# Patient Record
Sex: Male | Born: 1975 | Race: Black or African American | Hispanic: No | Marital: Married | Smoking: Never smoker
Health system: Southern US, Community
[De-identification: ages and names within clinical notes are randomized; demographics above are authoritative.]

## PROBLEM LIST (undated history)

## (undated) DIAGNOSIS — E7800 Pure hypercholesterolemia, unspecified: Secondary | ICD-10-CM

## (undated) DIAGNOSIS — N760 Acute vaginitis: Secondary | ICD-10-CM

## (undated) DIAGNOSIS — F32A Depression, unspecified: Secondary | ICD-10-CM

## (undated) DIAGNOSIS — I10 Essential (primary) hypertension: Secondary | ICD-10-CM

## (undated) DIAGNOSIS — F329 Major depressive disorder, single episode, unspecified: Secondary | ICD-10-CM

## (undated) HISTORY — DX: Pure hypercholesterolemia, unspecified: E78.00

## (undated) HISTORY — DX: Depression, unspecified: F32.A

## (undated) HISTORY — DX: Acute vaginitis: N76.0

## (undated) HISTORY — DX: Major depressive disorder, single episode, unspecified: F32.9

## (undated) SURGERY — COLONOSCOPY WITH PROPOFOL
Anesthesia: General

---

## 2011-11-28 ENCOUNTER — Ambulatory Visit: Payer: Self-pay | Admitting: Unknown Physician Specialty

## 2011-12-24 ENCOUNTER — Ambulatory Visit: Payer: Self-pay | Admitting: Unknown Physician Specialty

## 2015-11-16 ENCOUNTER — Telehealth: Payer: Self-pay | Admitting: Family Medicine

## 2015-11-16 ENCOUNTER — Encounter: Payer: Self-pay | Admitting: Family Medicine

## 2015-11-16 ENCOUNTER — Telehealth: Payer: Self-pay | Admitting: *Deleted

## 2015-11-16 ENCOUNTER — Ambulatory Visit (INDEPENDENT_AMBULATORY_CARE_PROVIDER_SITE_OTHER): Payer: BC Managed Care – PPO | Admitting: Family Medicine

## 2015-11-16 VITALS — BP 124/76 | HR 78 | Temp 98.8°F | Ht 70.0 in | Wt 236.8 lb

## 2015-11-16 DIAGNOSIS — Z0000 Encounter for general adult medical examination without abnormal findings: Secondary | ICD-10-CM | POA: Insufficient documentation

## 2015-11-16 DIAGNOSIS — Z23 Encounter for immunization: Secondary | ICD-10-CM | POA: Diagnosis not present

## 2015-11-16 DIAGNOSIS — Z13220 Encounter for screening for lipoid disorders: Secondary | ICD-10-CM | POA: Diagnosis not present

## 2015-11-16 DIAGNOSIS — Z114 Encounter for screening for human immunodeficiency virus [HIV]: Secondary | ICD-10-CM | POA: Diagnosis not present

## 2015-11-16 DIAGNOSIS — E669 Obesity, unspecified: Secondary | ICD-10-CM

## 2015-11-16 LAB — COMPREHENSIVE METABOLIC PANEL
ALT: 11 U/L (ref 0–53)
AST: 14 U/L (ref 0–37)
Albumin: 4.4 g/dL (ref 3.5–5.2)
Alkaline Phosphatase: 43 U/L (ref 39–117)
BILIRUBIN TOTAL: 0.5 mg/dL (ref 0.2–1.2)
BUN: 12 mg/dL (ref 6–23)
CALCIUM: 9.1 mg/dL (ref 8.4–10.5)
CHLORIDE: 104 meq/L (ref 96–112)
CO2: 28 meq/L (ref 19–32)
CREATININE: 1.11 mg/dL (ref 0.40–1.50)
GFR: 95.21 mL/min (ref >60.00)
GLUCOSE: 96 mg/dL (ref 70–99)
Potassium: 4.6 mEq/L (ref 3.5–5.1)
SODIUM: 138 meq/L (ref 135–145)
Total Protein: 7.5 g/dL (ref 6.0–8.3)

## 2015-11-16 LAB — LIPID PANEL
CHOL/HDL RATIO: 5
Cholesterol: 262 mg/dL — ABNORMAL HIGH (ref 0–200)
HDL: 50 mg/dL (ref >39.00)
LDL CALC: 203 mg/dL — AB (ref 0–99)
NONHDL: 212.49
TRIGLYCERIDES: 45 mg/dL (ref 0.0–149.0)
VLDL: 9 mg/dL (ref 0.0–40.0)

## 2015-11-16 LAB — HEMOGLOBIN A1C: Hgb A1c MFr Bld: 5.8 % (ref 4.6–6.5)

## 2015-11-16 NOTE — Telephone Encounter (Signed)
Patient requesting results, they are in EPIC please advise?

## 2015-11-16 NOTE — Telephone Encounter (Signed)
Patient is wanting to try diet and exercise to get the cholesterol down first. He will call back once he looks at his schedule to make a follow up appointment.

## 2015-11-16 NOTE — Assessment & Plan Note (Signed)
Overall patient is doing well. He is obese by BMI. We discussed diet and exercise at length. He was advised on exercise. Advised to start with 2 days a week. Advised goal is 150 minutes a week. He is given a dietary handout. He declined flu shot today. He is given a Tdap today. He does not smoke. His blood pressure is in the normal range. He'll continue to follow with his physician in [place] for his ADHD. He'll continue to monitor for any recurrence of his prior fever and body ache symptoms. We will check a CMP, lipid panel, A1c, and HIV today.

## 2015-11-16 NOTE — Telephone Encounter (Signed)
I attempted to call the patient regarding his lab work. There is no answer. Left mastectomy asking him to call back to the office.  When he calls back please inform him that his cholesterol is elevated. His LDL cholesterol is 203. This is in the range where he would benefit from a medication to treat this. I would like to start him on Lipitor if he is willing. He needs to work on diet and exercise. I would like for him to follow-up in a month to see how he is doing with diet and exercise.

## 2015-11-16 NOTE — Addendum Note (Signed)
Addended by: Leone Haven on: 11/16/2015 09:45 AM   Modules accepted: Level of Service

## 2015-11-16 NOTE — Telephone Encounter (Signed)
Please see my previous phone note. I will forward it to you.

## 2015-11-16 NOTE — Progress Notes (Signed)
Patient ID: Benjamin Summers, male   DOB: Jan 18, 1976, 39 y.o.   MRN: [id number]  Benjamin Rumps, MD Phone: [phone]  Benjamin Summers is a 39 y.o. male who presents today for new patient visit.  Patient comes in to establish care today and for yearly physical. He notes overall he is doing well. He has no complaints today. He does note he had several fevers a couple months ago that were associated with body aches, though had no other symptoms. These resolved after several days. Has not had any recurrence of these. He had no upper respiratory issues with this, no shortness of breath, no chest pain, no abdominal pain, no rash, no urinary issues, and no persistent fevers. Patient does not exercise. He does note he walks a lot at work. States his diet is pretty good. Does not eat much fast food or fried foods. Sometimes he skips breakfast. He drinks mostly water. He has 3-4 sodas or sweet tea's a week. He gets vegetables most meals. Does not do much dairy. He cannot remember when he had his last tetanus shot. He thinks it was about 10 years ago. He declines flu shot. He has about 1 alcoholic drink a day. He does not smoke cigarettes. He does not use recreational drugs. He does note he is followed by a physician in [place] for his ADHD. They prescribe him Adderall. He notes a history of depression the past, though no depression at this time. No SI or HI.  Active Ambulatory Problems    Diagnosis Date Noted  . Annual physical exam 11/16/2015  . Obesity (BMI 30.0-34.9) 11/16/2015   Resolved Ambulatory Problems    Diagnosis Date Noted  . No Resolved Ambulatory Problems   Past Medical History  Diagnosis Date  . Elevated cholesterol   . Depression     Family History  Problem Relation Age of Onset  . Hypertension Mother   . Diabetes Mother     Social History   Social History  . Marital Status: Married    Spouse Name: N/A  . Number of Children: N/A  . Years of Education: N/A   Occupational  History  . Not on file.   Social History Main Topics  . Smoking status: Never Smoker   . Smokeless tobacco: Not on file  . Alcohol Use: 4.2 oz/week    7 Standard drinks or equivalent per week  . Drug Use: No  . Sexual Activity: Not on file   Other Topics Concern  . Not on file   Social History Narrative  . No narrative on file    ROS   General:  Negative for nexplained weight loss, positive for fever (see above) Skin: Negative for new or changing mole, sore that won't heal HEENT: Negative for trouble hearing, trouble seeing, ringing in ears, mouth sores, hoarseness, change in voice, dysphagia. CV:  Negative for chest pain, dyspnea, edema, palpitations Resp: Negative for cough, dyspnea, hemoptysis GI: Negative for nausea, vomiting, diarrhea, constipation, abdominal pain, melena, hematochezia. GU: Negative for dysuria, incontinence, urinary hesitance, hematuria, vaginal or penile discharge, polyuria, sexual difficulty, lumps in testicle or breasts MSK: Negative for muscle cramps or aches, joint pain or swelling Neuro: Negative for headaches, weakness, numbness, dizziness, passing out/fainting Psych: Negative for depression, anxiety, memory problems  Objective  Physical Exam Filed Vitals:   11/16/15 0806  BP: 124/76  Pulse: 78  Temp: 98.8 F (37.1 C)    Physical Exam  Constitutional: He is well-developed, well-nourished, and in no distress.  HENT:  Head: Normocephalic and atraumatic.  Right Ear: External ear normal.  Left Ear: External ear normal.  Mouth/Throat: Oropharynx is clear and moist. No oropharyngeal exudate.  Eyes: Conjunctivae are normal. Pupils are equal, round, and reactive to light.  Neck: Neck supple.  Cardiovascular: Normal rate, regular rhythm and normal heart sounds.  Exam reveals no gallop and no friction rub.   No murmur heard. Pulmonary/Chest: Effort normal and breath sounds normal. No respiratory distress. He has no wheezes. He has no rales.    Abdominal: Soft. Bowel sounds are normal. He exhibits no distension. There is no tenderness. There is no rebound and no guarding.  Musculoskeletal: He exhibits no edema.  Lymphadenopathy:    He has no cervical adenopathy.  Neurological: He is alert. Gait normal.  Skin: Skin is warm and dry. He is not diaphoretic.  Psychiatric: Mood and affect normal.     Assessment/Plan:   Annual physical exam Overall patient is doing well. He is obese by BMI. We discussed diet and exercise at length. He was advised on exercise. Advised to start with 2 days a week. Advised goal is 150 minutes a week. He is given a dietary handout. He declined flu shot today. He is given a Tdap today. He does not smoke. His blood pressure is in the normal range. He'll continue to follow with his physician in [place] for his ADHD. He'll continue to monitor for any recurrence of his prior fever and body ache symptoms. We will check a CMP, lipid panel, A1c, and HIV today.  Obesity (BMI 30.0-34.9) Advised at length on diet and exercise. See annual physical exam for discussion.    Orders Placed This Encounter  Procedures  . Tdap vaccine greater than or equal to 7yo IM  . Lipid Profile  . HIV antibody (with reflex)  . Comp Met (CMET)  . HgB A1c   Benjamin Summers

## 2015-11-16 NOTE — Progress Notes (Signed)
Pre visit review using our clinic review tool, if applicable. No additional management support is needed unless otherwise documented below in the visit note. 

## 2015-11-16 NOTE — Assessment & Plan Note (Signed)
Advised at length on diet and exercise. See annual physical exam for discussion.

## 2015-11-16 NOTE — Patient Instructions (Addendum)
Meet you. Please work on diet and exercise. The goal for exercise is 150 minutes a week. You can start with a couple days a week as you're able to. Please look at the diet listed below and incorporate some changes. We will check some lab work and call with the results.  Diet Recommendations  Starchy (carb) foods: Bread, rice, pasta, potatoes, corn, cereal, grits, crackers, bagels, muffins, all baked goods.  (Fruits, milk, and yogurt also have carbohydrate, but most of these foods will not spike your blood sugar as the starchy foods will.)  A few fruits do cause high blood sugars; use small portions of bananas (limit to 1/2 at a time), grapes, watermelon, oranges, and most tropical fruits.    Protein foods: Meat, fish, poultry, eggs, dairy foods, and beans such as pinto and kidney beans (beans also provide carbohydrate).   1. Eat at least 3 meals and 1-2 snacks per day. Never go more than 4-5 hours while awake without eating. Eat breakfast within the first hour of getting up.   2. Limit starchy foods to TWO per meal and ONE per snack. ONE portion of a starchy  food is equal to the following:   - ONE slice of bread (or its equivalent, such as half of a hamburger bun).   - 1/2 cup of a "scoopable" starchy food such as potatoes or rice.   - 15 grams of carbohydrate as shown on food label.  3. Include at every meal: a protein food, a carb food, and vegetables and/or fruit.   - Obtain twice the volume of veg's as protein or carbohydrate foods for both lunch and dinner.   - Fresh or frozen veg's are best.   - Keep frozen veg's on hand for a quick vegetable serving.

## 2015-11-16 NOTE — Telephone Encounter (Signed)
Patient requested a call back for lab results.12/08 Please Advise

## 2015-11-17 LAB — HIV ANTIBODY (ROUTINE TESTING W REFLEX): HIV: NONREACTIVE

## 2015-11-17 NOTE — Telephone Encounter (Signed)
Noted  

## 2015-11-17 NOTE — Telephone Encounter (Signed)
Benjamin Summers spoke with patient yesterday

## 2017-12-30 ENCOUNTER — Ambulatory Visit: Payer: BC Managed Care – PPO | Admitting: Family Medicine

## 2017-12-30 ENCOUNTER — Other Ambulatory Visit: Payer: Self-pay

## 2017-12-30 ENCOUNTER — Encounter: Payer: Self-pay | Admitting: Family Medicine

## 2017-12-30 VITALS — BP 138/100 | HR 83 | Temp 98.5°F | Ht 70.0 in | Wt 267.1 lb

## 2017-12-30 DIAGNOSIS — D224 Melanocytic nevi of scalp and neck: Secondary | ICD-10-CM

## 2017-12-30 DIAGNOSIS — F419 Anxiety disorder, unspecified: Secondary | ICD-10-CM

## 2017-12-30 DIAGNOSIS — R202 Paresthesia of skin: Secondary | ICD-10-CM | POA: Diagnosis not present

## 2017-12-30 DIAGNOSIS — R030 Elevated blood-pressure reading, without diagnosis of hypertension: Secondary | ICD-10-CM | POA: Diagnosis not present

## 2017-12-30 DIAGNOSIS — G44209 Tension-type headache, unspecified, not intractable: Secondary | ICD-10-CM | POA: Diagnosis not present

## 2017-12-30 DIAGNOSIS — E669 Obesity, unspecified: Secondary | ICD-10-CM | POA: Diagnosis not present

## 2017-12-30 NOTE — Progress Notes (Signed)
Benjamin Rumps, MD Phone: [phone]  Benjamin Summers is a 42 y.o. male who presents today for.  Patient notes chronic intermittent left forearm volar aspect tingling and numb sensation as well as left posterior leg numb and tingling sensation.  These been going on intermittently since he was 21.  They will last for several days and go away.  Notes intermittent low back pain.  Occasional throbbing pain down the back of his left leg.  No saddle anesthesia or bowel or bladder incontinence.  Occasional neck pain as well.  No shooting pains into his arms or legs.  No numbness or tingling elsewhere in either left upper extremity or left lower extremity.  No right sided symptoms.  Notes for several months he has had a left-sided headache that will last for few days.  He notes no new numbness or weakness.  It will go away on its own.  Will improve with over-the-counter medicine.  He did see his eye doctor to make sure his vision was okay.  He reports a hyperpigmented spot on his left scalp that has been present over the last year or so.  He wonders if he should have this looked at.  Blood pressure has been high in the past.  He notes no chest pain or shortness of breath.  No medication currently.  Social History   Tobacco Use  Smoking Status Never Smoker  Smokeless Tobacco Never Used     ROS see history of present illness  Objective  Physical Exam Vitals:   12/30/17 1406  BP: (!) 138/100  Pulse: 83  Temp: 98.5 F (36.9 C)  SpO2: 96%    BP Readings from Last 3 Encounters:  12/30/17 (!) 138/100  11/16/15 124/76   Wt Readings from Last 3 Encounters:  12/30/17 267 lb 1.9 oz (121.2 kg)  11/16/15 236 lb 12.8 oz (107.4 kg)    Physical Exam  Constitutional: No distress.  HENT:  Head:    Cardiovascular: Normal rate, regular rhythm and normal heart sounds.  Pulmonary/Chest: Effort normal and breath sounds normal.  Musculoskeletal: He exhibits no edema.  No midline spine  tenderness, no midline spine step-off, no muscular back or neck tenderness  Neurological: He is alert. Gait normal.  CN 2-12 intact, 5/5 strength in bilateral biceps, triceps, grip, quads, hamstrings, plantar and dorsiflexion, sensation to light touch intact in bilateral UE and LE, sensation to temperature and pain intact bilateral upper extremities, normal gait  Skin: Skin is warm and dry. He is not diaphoretic.     Assessment/Plan: Please see individual problem list.  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  Elevated blood pressure reading Elevated today.  We will have him return for blood pressure check and if still elevated start on medication.  Obesity (BMI 35.0-39.9 without comorbidity) Patient is put on about 30 pounds.  Discussed working on diet and exercise.  Paresthesia of left arm and leg Patient with tingling sensation in the volar aspect of his left forearm as well as his posterior left leg with intermittent associated neck and back discomfort.  Suspect nerve impingement.  He is neurologically intact.  I do not think this is a central lesion based on his chronicity and presenting symptoms.  We will refer to neurology for further evaluation.  Anxiety At the end of the visit the patient brought up that he has been anxious.  He requests Xanax.  He notes no depression.  I advised that  I typically do not prescribe Xanax without trying an additional medication such as an SSRI.  He voiced understanding and continued to request the Xanax as this has worked for him in the past.  I advised that we could discuss this further at a follow-up visit.  Headache Suspect tension headache versus related to blood pressure.  Will refer to neurology and recheck blood pressure in 1 week.   Orders Placed This Encounter  Procedures  . Lipid panel    Standing Status:   Future    Number of Occurrences:   1    Standing Expiration Date:    12/30/2018  . Comp Met (CMET)    Standing Status:   Future    Number of Occurrences:   1    Standing Expiration Date:   12/30/2018  . Hemoglobin A1c    Standing Status:   Future    Number of Occurrences:   1    Standing Expiration Date:   12/30/2018  . CBC    Standing Status:   Future    Number of Occurrences:   1    Standing Expiration Date:   12/30/2018  . TSH    Standing Status:   Future    Number of Occurrences:   1    Standing Expiration Date:   12/30/2018  . Ambulatory referral to Neurology    Referral Priority:   Routine    Referral Type:   Consultation    Referral Reason:   Specialty Services Required    Requested Specialty:   Neurology    Number of Visits Requested:   1  . Ambulatory referral to Dermatology    Referral Priority:   Routine    Referral Type:   Consultation    Referral Reason:   Specialty Services Required    Requested Specialty:   Dermatology    Number of Visits Requested:   1    No orders of the defined types were placed in this encounter.  The 10-year ASCVD risk score Mikey Bussing DC Brooke Bonito., et al., 2013) is: 3.7%   Values used to calculate the score:     Age: 89 years     Sex: Male     Is Non-Hispanic African American: Yes     Diabetic: No     Tobacco smoker: No     Systolic Blood Pressure: 594 mmHg     Is BP treated: No     HDL Cholesterol: 51.5 mg/dL     Total Cholesterol: 247 mg/dL   Benjamin Rumps, MD [place]

## 2017-12-30 NOTE — Patient Instructions (Signed)
Nice to see you. We will refer you to neurology for the tingling sensation that she has had chronically. We will get you to see dermatology. We will have you return for fasting lab work. If you develop numbness, weakness, worsening headache, vision changes, or any new or changing symptoms please seek medical attention.

## 2017-12-31 ENCOUNTER — Other Ambulatory Visit (INDEPENDENT_AMBULATORY_CARE_PROVIDER_SITE_OTHER): Payer: BC Managed Care – PPO

## 2017-12-31 DIAGNOSIS — R030 Elevated blood-pressure reading, without diagnosis of hypertension: Secondary | ICD-10-CM

## 2017-12-31 LAB — LIPID PANEL
Cholesterol: 247 mg/dL — ABNORMAL HIGH (ref 0–200)
HDL: 51.5 mg/dL (ref >39.00)
LDL CALC: 179 mg/dL — AB (ref 0–99)
NONHDL: 195.54
Total CHOL/HDL Ratio: 5
Triglycerides: 83 mg/dL (ref 0.0–149.0)
VLDL: 16.6 mg/dL (ref 0.0–40.0)

## 2017-12-31 LAB — CBC
HCT: 45.8 % (ref 39.0–52.0)
Hemoglobin: 15.2 g/dL (ref 13.0–17.0)
MCHC: 33.2 g/dL (ref 30.0–36.0)
MCV: 83.9 fl (ref 78.0–100.0)
PLATELETS: 198 10*3/uL (ref 150.0–400.0)
RBC: 5.46 Mil/uL (ref 4.22–5.81)
RDW: 14.4 % (ref 11.5–15.5)
WBC: 5.9 10*3/uL (ref 4.0–10.5)

## 2017-12-31 LAB — COMPREHENSIVE METABOLIC PANEL
ALT: 13 U/L (ref 0–53)
AST: 12 U/L (ref 0–37)
Albumin: 4.5 g/dL (ref 3.5–5.2)
Alkaline Phosphatase: 40 U/L (ref 39–117)
BUN: 11 mg/dL (ref 6–23)
CO2: 23 mEq/L (ref 19–32)
CREATININE: 1.25 mg/dL (ref 0.40–1.50)
Calcium: 9.2 mg/dL (ref 8.4–10.5)
Chloride: 105 mEq/L (ref 96–112)
GFR: 82.11 mL/min (ref >60.00)
GLUCOSE: 100 mg/dL — AB (ref 70–99)
POTASSIUM: 4.2 meq/L (ref 3.5–5.1)
SODIUM: 141 meq/L (ref 135–145)
TOTAL PROTEIN: 7.7 g/dL (ref 6.0–8.3)
Total Bilirubin: 0.4 mg/dL (ref 0.2–1.2)

## 2017-12-31 LAB — HEMOGLOBIN A1C: HEMOGLOBIN A1C: 6 % (ref 4.6–6.5)

## 2017-12-31 LAB — TSH: TSH: 1.64 u[IU]/mL (ref 0.35–4.50)

## 2018-01-01 DIAGNOSIS — I10 Essential (primary) hypertension: Secondary | ICD-10-CM | POA: Insufficient documentation

## 2018-01-01 DIAGNOSIS — F419 Anxiety disorder, unspecified: Secondary | ICD-10-CM | POA: Insufficient documentation

## 2018-01-01 DIAGNOSIS — R202 Paresthesia of skin: Secondary | ICD-10-CM | POA: Insufficient documentation

## 2018-01-01 DIAGNOSIS — R51 Headache: Secondary | ICD-10-CM

## 2018-01-01 DIAGNOSIS — D224 Melanocytic nevi of scalp and neck: Secondary | ICD-10-CM | POA: Insufficient documentation

## 2018-01-01 DIAGNOSIS — R519 Headache, unspecified: Secondary | ICD-10-CM | POA: Insufficient documentation

## 2018-01-01 NOTE — Assessment & Plan Note (Signed)
Elevated today.  We will have him return for blood pressure check and if still elevated start on medication.

## 2018-01-01 NOTE — Assessment & Plan Note (Signed)
Suspect tension headache versus related to blood pressure.  Will refer to neurology and recheck blood pressure in 1 week.

## 2018-01-01 NOTE — Assessment & Plan Note (Signed)
At the end of the visit the patient brought up that he has been anxious.  He requests Xanax.  He notes no depression.  I advised that I typically do not prescribe Xanax without trying an additional medication such as an SSRI.  He voiced understanding and continued to request the Xanax as this has worked for him in the past.  I advised that we could discuss this further at a follow-up visit.

## 2018-01-01 NOTE — Assessment & Plan Note (Signed)
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

## 2018-01-01 NOTE — Assessment & Plan Note (Signed)
Patient with tingling sensation in the volar aspect of his left forearm as well as his posterior left leg with intermittent associated neck and back discomfort.  Suspect nerve impingement.  He is neurologically intact.  I do not think this is a central lesion based on his chronicity and presenting symptoms.  We will refer to neurology for further evaluation.

## 2018-01-01 NOTE — Assessment & Plan Note (Signed)
Patient is put on about 30 pounds.  Discussed working on diet and exercise.

## 2018-01-06 ENCOUNTER — Telehealth: Payer: Self-pay

## 2018-01-06 NOTE — Telephone Encounter (Signed)
Copied from [place]. Topic: Quick Communication - Lab Results >> Jan 06, 2018  3:10 PM Juanda Chance, [place] wrote: Left message to schedule patient for blood pressure check ok for pec to schedule a blood pressure check with the nurse. >> Jan 06, 2018  4:01 PM Yvette Rack wrote: Pt would hate to come into office and pay $40 co pay just to get BP checked please call pt to let him know if he need to pay or not

## 2018-01-07 NOTE — Telephone Encounter (Signed)
Left voice mail to call back ok for PEC to speak to patient 

## 2018-01-07 NOTE — Telephone Encounter (Signed)
Please contact the patient and see if he has been checking his blood pressure at home.  If he has please find out what his numbers have been.  If he has not I would suggest he come to the office to have it checked.

## 2018-01-09 NOTE — Telephone Encounter (Signed)
Left voice mail to call back ok for PEC to speak to patient 

## 2018-01-13 ENCOUNTER — Ambulatory Visit: Payer: Self-pay | Admitting: *Deleted

## 2018-01-13 NOTE — Telephone Encounter (Signed)
Pt  States  He  Was  Told   By  The office  To  Have  His  bp   Checked   Triage  assesment   Done    Pt  Reports  No   New  Symptoms    Has  appt  With  Neurologist  Feb  25    Dr Tharon Aquas  Office  Called  And  Spoke  With  [place]  Who stated  He needed  A  Nurse visit  For  bp  Check   She  Request pt transferred  To  Her  So  She  Can make  Him an  Appointment    Answer Assessment - Initial Assessment Questions 1. SYMPTOM: "What is the main symptom you are concerned about?" (e.g., weakness, numbness)     Numbness   And  Tingling    l  Arm  And l  Side  No  Worse   Than  When  He  Saw  Dr  Caryl Bis   bp  Elevated   144/100  5  mins  Ago   2. ONSET: "When did this start?" (minutes, hours, days; while sleeping)        20 years  Chronic  3. LAST NORMAL: "When was the last time you were normal (no symptoms)?"         chronic 4. PATTERN "Does this come and go, or has it been constant since it started?"  "Is it present now?"       Constant   5. CARDIAC SYMPTOMS: "Have you had any of the following symptoms: chest pain, difficulty breathing, palpitations?"         No   6. NEUROLOGIC SYMPTOMS: "Have you had any of the following symptoms: headache, dizziness, vision loss, double vision, changes in speech, unsteady on your feet?"           No    7. OTHER SYMPTOMS: "Do you have any other symptoms?"     hypertension 8. PREGNANCY: "Is there any chance you are pregnant?" "When was your last menstrual period?"     n/a  Protocols used: NEUROLOGIC DEFICIT-A-AH

## 2018-01-14 ENCOUNTER — Ambulatory Visit (INDEPENDENT_AMBULATORY_CARE_PROVIDER_SITE_OTHER): Payer: BC Managed Care – PPO | Admitting: *Deleted

## 2018-01-14 VITALS — BP 138/98 | HR 78 | Resp 20

## 2018-01-14 DIAGNOSIS — R030 Elevated blood-pressure reading, without diagnosis of hypertension: Secondary | ICD-10-CM

## 2018-01-14 NOTE — Progress Notes (Signed)
Patient presented for follow up on BP with nursing from visit dated 12/30/17. Patient allowed to rest 15 to 20 min BP attained in left arm 138/98 pulse 78.

## 2018-01-14 NOTE — Telephone Encounter (Signed)
Patient has appointment for nurse visit today 01/14/18 ,he never returned my call

## 2018-01-14 NOTE — Progress Notes (Addendum)
Blood pressure remains uncontrolled.  I would suggest placing him on medication for this.  If he is willing I can send amlodipine to his pharmacy.  Thanks.

## 2018-01-16 NOTE — Progress Notes (Signed)
Patient staed he wants time to think about going on medication he will call back when he decides.

## 2018-01-30 ENCOUNTER — Telehealth: Payer: Self-pay | Admitting: Family Medicine

## 2018-01-30 NOTE — Telephone Encounter (Unsigned)
Copied from [place] [id number]. Topic: Quick Communication - See Telephone Encounter >> Jan 30, 2018  2:03 PM Hewitt Shorts wrote: CRM for notification. See Telephone encounter for: pt called to say that dr Caryl Bis wanted him to start on a blood pressure medication but pt doesn't know what the name of the medication is pt does want to move forward but make sure that is generic brand  [facility] [address]     01/30/18.

## 2018-01-30 NOTE — Telephone Encounter (Signed)
Please advise 

## 2018-02-01 MED ORDER — AMLODIPINE BESYLATE 5 MG PO TABS: 5.0000 mg | ORAL_TABLET | Freq: Every day | ORAL | 3 refills | Status: DC

## 2018-02-01 NOTE — Telephone Encounter (Signed)
I have sent in amlodipine to his pharmacy.  Please let him know this.  He needs a recheck in 1 month after starting this.  Thanks.

## 2018-02-02 NOTE — Telephone Encounter (Signed)
Left message to notify

## 2018-02-19 ENCOUNTER — Encounter: Payer: Self-pay | Admitting: Family Medicine

## 2018-02-19 ENCOUNTER — Ambulatory Visit: Payer: BC Managed Care – PPO | Admitting: Family Medicine

## 2018-02-19 DIAGNOSIS — R202 Paresthesia of skin: Secondary | ICD-10-CM | POA: Diagnosis not present

## 2018-02-19 DIAGNOSIS — G44209 Tension-type headache, unspecified, not intractable: Secondary | ICD-10-CM | POA: Diagnosis not present

## 2018-02-19 DIAGNOSIS — D224 Melanocytic nevi of scalp and neck: Secondary | ICD-10-CM | POA: Diagnosis not present

## 2018-02-19 DIAGNOSIS — I10 Essential (primary) hypertension: Secondary | ICD-10-CM

## 2018-02-19 DIAGNOSIS — F419 Anxiety disorder, unspecified: Secondary | ICD-10-CM

## 2018-02-19 MED ORDER — AMLODIPINE BESYLATE 10 MG PO TABS: 10.0000 mg | ORAL_TABLET | Freq: Every day | ORAL | 3 refills | Status: DC

## 2018-02-19 NOTE — Patient Instructions (Signed)
Nice to see you. Your blood pressure is better though still not quite at goal.  We will increase your amlodipine to 10 mg.  You may take two 5 mg tablets daily until you run out and then get the new prescription. If you decide you want to proceed with neurological evaluation please let us know and we can get an MRI or refer you back to neurology. If you would like to try something for anxiety on a daily basis please let us know.

## 2018-02-19 NOTE — Assessment & Plan Note (Signed)
No recurrence.  Monitor for recurrence.

## 2018-02-19 NOTE — Assessment & Plan Note (Signed)
Suspect benign lesion based on appearance.  He defers dermatology evaluation at this time.

## 2018-02-19 NOTE — Assessment & Plan Note (Signed)
Continues to have intermittent tingling though this does appear somewhat improved.  I am not sure if it is just coincidence with starting the amlodipine or if it is a response to the medicine versus response to his decreased blood pressure.  I did advise neurology evaluation.  Discussed we could also perform MRI brain to evaluate further though he defers neurological evaluation at this time and wants to monitor for further improvement.  If not improving he will contact us for referral or MRI.  Given return precautions.

## 2018-02-19 NOTE — Assessment & Plan Note (Signed)
Discussed treatment with SSRI or therapy.  He deferred at this time.  He will monitor.

## 2018-02-19 NOTE — Progress Notes (Signed)
[name], MD Phone: [phone]  Benjamin Summers is a 42 y.o. male who presents today for follow-up.  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  Edema- no  Patient reports the tingling that he gets in the left side of his body has been improved from prior.  Typically occurs in the volar aspect of his left arm in the posterior aspect of his left leg.  Never occurs on the right side of his body.  He noticed the improvement after starting on the blood pressure medication.  He did not end up seeing neurology mostly due to financial issues.  He notes no headaches.  No new tingling.  No numbness, weakness, or other neurological issues.  Anxiety: In the past he has been on benzos as well as Lexapro.  He thinks he had some sexual dysfunction with the Lexapro.  Many of the benzos did not work quickly enough for him.  Notes slight depression.  No SI.  He did not see dermatology for the nevus on his scalp.  It has not changed since our last visit.  He reports financially it is not feasible for him to see dermatology at this time.  Social History   Tobacco Use  Smoking Status Never Smoker  Smokeless Tobacco Never Used     ROS see history of present illness  Objective  Physical Exam Vitals:   02/19/18 0955  BP: 140/90  Pulse: 77  Temp: 98.4 F (36.9 C)  SpO2: 97%    BP Readings from Last 3 Encounters:  02/19/18 140/90  01/14/18 (!) 138/98  12/30/17 (!) 138/100   Wt Readings from Last 3 Encounters:  02/19/18 265 lb 6.4 oz (120.4 kg)  12/30/17 267 lb 1.9 oz (121.2 kg)  11/16/15 236 lb 12.8 oz (107.4 kg)    Physical Exam  Constitutional: No distress.  Cardiovascular: Normal rate, regular rhythm and normal heart sounds.  Pulmonary/Chest: Effort normal and breath sounds normal.  Musculoskeletal: He exhibits no edema.  Neurological: He is alert. Gait normal.  CN  2-12 intact, 5/5 strength in bilateral biceps, triceps, grip, quads, hamstrings, plantar and dorsiflexion, sensation to light touch intact in bilateral UE and LE  Skin: Skin is warm and dry. He is not diaphoretic.  Nevus on left scalp appears similar to prior is a 4 mm circular hyperpigmented lesion with regular borders and uniform color   Assessment/Plan: Please see individual problem list.  Nevus of scalp Suspect benign lesion based on appearance.  He defers dermatology evaluation at this time.  Paresthesia of left arm and leg Continues to have intermittent tingling though this does appear somewhat improved.  I am not sure if it is just coincidence with starting the amlodipine or if it is a response to the medicine versus response to his decreased blood pressure.  I did advise neurology evaluation.  Discussed we could also perform MRI brain to evaluate further though he defers neurological evaluation at this time and wants to monitor for further improvement.  If not improving he will contact us for referral or MRI.  Given return precautions.  Headache No recurrence.  Monitor for recurrence.  Hypertension Blood pressure improved.  Not quite at goal though.  We will increase his amlodipine to 10 mg.  Follow-up in 1 month for BP check with nursing.  Anxiety Discussed treatment with SSRI or therapy.  He deferred at this time.  He  will monitor.  No orders of the defined types were placed in this encounter.   Meds ordered this encounter  Medications  . amLODipine (NORVASC) 10 MG tablet    Sig: Take 1 tablet (10 mg total) by mouth daily.    Dispense:  90 tablet    Refill:  [place], MD [place]

## 2018-02-19 NOTE — Assessment & Plan Note (Signed)
Blood pressure improved.  Not quite at goal though.  We will increase his amlodipine to 10 mg.  Follow-up in 1 month for BP check with nursing.

## 2018-03-25 ENCOUNTER — Telehealth: Payer: Self-pay | Admitting: *Deleted

## 2018-03-25 ENCOUNTER — Ambulatory Visit (INDEPENDENT_AMBULATORY_CARE_PROVIDER_SITE_OTHER): Payer: BC Managed Care – PPO | Admitting: *Deleted

## 2018-03-25 VITALS — BP 140/88 | HR 78 | Resp 18

## 2018-03-25 DIAGNOSIS — I10 Essential (primary) hypertension: Secondary | ICD-10-CM

## 2018-03-25 NOTE — Telephone Encounter (Signed)
Left message to call office [facility] nurse may advise patient of below statement from PCP.   Attestation signed by Leone Haven, MD at 03/25/2018 12:29 PM  Blood pressure is pretty much unchanged from prior.  Still not at goal.  I would suggest adding an additional blood pressure medication such as hydrochlorothiazide.  This would require lab work in several weeks.  If he would like to do this I can send it to his pharmacy.

## 2018-03-25 NOTE — Progress Notes (Signed)
Patient presented for one month follow up on BP after increasing amlodipine to 10 mg daily . BP taken in left arm 138/82 pulse 78 patient allowed to rest additional 10 -12 minutes BP in right arm 140/88 pulse 78.

## 2018-03-25 NOTE — Progress Notes (Signed)
Left message for patient  To call office see telephone encounter.

## 2018-03-26 ENCOUNTER — Telehealth: Payer: Self-pay | Admitting: Family Medicine

## 2018-03-26 DIAGNOSIS — I10 Essential (primary) hypertension: Secondary | ICD-10-CM

## 2018-03-26 NOTE — Telephone Encounter (Signed)
Patient states that she would like to try the hydrochlorothiazide.

## 2018-03-26 NOTE — Telephone Encounter (Signed)
Message from Dr. Caryl Bis read to patient; verbalizes understanding. Agrees to hydrochlorothiazide. [facility] [address]   [place].  Please advise re: time frame for lab draw. [phone]

## 2018-03-26 NOTE — Telephone Encounter (Signed)
Patient would like to try hydrocholorthiazide

## 2018-03-28 MED ORDER — HYDROCHLOROTHIAZIDE 12.5 MG PO TABS: 12.5000 mg | ORAL_TABLET | Freq: Every day | ORAL | 1 refills | Status: DC

## 2018-03-28 NOTE — Addendum Note (Signed)
Addended by: Caryl Bis, Ormand Senn G on: 03/28/2018 10:09 AM   Modules accepted: Orders

## 2018-03-28 NOTE — Telephone Encounter (Signed)
HCTZ sent to pharmacy.  He will need to be set up for lab work in 3-4 weeks.  An order has been placed.  Please get this scheduled.  Thanks.

## 2018-03-30 NOTE — Telephone Encounter (Signed)
Called patient to get him scheduled for lab visit unable to leave voicemail due to patient not having voicemail being set up.

## 2018-04-01 NOTE — Telephone Encounter (Signed)
Called patient on several occasions and was not able to leave a message due to voicemail not being set up.

## 2018-04-01 NOTE — Telephone Encounter (Signed)
Sent patient a letter due to not being able to reach patient by phone to contact office to schedule lab appointment per Dr. Caryl Bis.

## 2018-04-09 NOTE — Progress Notes (Signed)
See telephone encounter.

## 2018-04-09 NOTE — Telephone Encounter (Signed)
Letter mailed

## 2018-04-23 ENCOUNTER — Other Ambulatory Visit (INDEPENDENT_AMBULATORY_CARE_PROVIDER_SITE_OTHER): Payer: BC Managed Care – PPO

## 2018-04-23 DIAGNOSIS — I10 Essential (primary) hypertension: Secondary | ICD-10-CM | POA: Diagnosis not present

## 2018-04-23 LAB — BASIC METABOLIC PANEL
BUN: 19 mg/dL (ref 6–23)
CALCIUM: 8.8 mg/dL (ref 8.4–10.5)
CHLORIDE: 101 meq/L (ref 96–112)
CO2: 28 meq/L (ref 19–32)
CREATININE: 1.18 mg/dL (ref 0.40–1.50)
GFR: 87.62 mL/min (ref >60.00)
Glucose, Bld: 84 mg/dL (ref 70–99)
Potassium: 3.9 mEq/L (ref 3.5–5.1)
Sodium: 137 mEq/L (ref 135–145)

## 2018-05-26 ENCOUNTER — Ambulatory Visit: Payer: BC Managed Care – PPO | Admitting: Family Medicine

## 2018-06-19 ENCOUNTER — Ambulatory Visit: Payer: BC Managed Care – PPO | Admitting: Psychology

## 2018-09-20 ENCOUNTER — Other Ambulatory Visit: Payer: Self-pay | Admitting: Family Medicine

## 2018-10-19 ENCOUNTER — Emergency Department
Admission: EM | Admit: 2018-10-19 | Discharge: 2018-10-19 | Disposition: A | Discharge status: Home / Self Care | Payer: BC Managed Care – PPO | Attending: Emergency Medicine | Admitting: Emergency Medicine

## 2018-10-19 ENCOUNTER — Emergency Department: Payer: BC Managed Care – PPO

## 2018-10-19 ENCOUNTER — Other Ambulatory Visit: Payer: Self-pay

## 2018-10-19 ENCOUNTER — Encounter: Payer: Self-pay | Admitting: Emergency Medicine

## 2018-10-19 DIAGNOSIS — R109 Unspecified abdominal pain: Secondary | ICD-10-CM | POA: Diagnosis present

## 2018-10-19 DIAGNOSIS — Z79899 Other long term (current) drug therapy: Secondary | ICD-10-CM | POA: Diagnosis not present

## 2018-10-19 DIAGNOSIS — N200 Calculus of kidney: Secondary | ICD-10-CM | POA: Diagnosis not present

## 2018-10-19 DIAGNOSIS — I10 Essential (primary) hypertension: Secondary | ICD-10-CM | POA: Insufficient documentation

## 2018-10-19 HISTORY — DX: Essential (primary) hypertension: I10

## 2018-10-19 LAB — COMPREHENSIVE METABOLIC PANEL
ALT: 21 U/L (ref 0–44)
AST: 19 U/L (ref 15–41)
Albumin: 4.7 g/dL (ref 3.5–5.0)
Alkaline Phosphatase: 49 U/L (ref 38–126)
Anion gap: 7 (ref 5–15)
BUN: 21 mg/dL — ABNORMAL HIGH (ref 6–20)
CHLORIDE: 102 mmol/L (ref 98–111)
CO2: 27 mmol/L (ref 22–32)
Calcium: 9 mg/dL (ref 8.9–10.3)
Creatinine, Ser: 1.68 mg/dL — ABNORMAL HIGH (ref 0.61–1.24)
GFR, EST AFRICAN AMERICAN: 57 mL/min — AB (ref >60)
GFR, EST NON AFRICAN AMERICAN: 49 mL/min — AB (ref >60)
Glucose, Bld: 109 mg/dL — ABNORMAL HIGH (ref 70–99)
POTASSIUM: 3.5 mmol/L (ref 3.5–5.1)
SODIUM: 136 mmol/L (ref 135–145)
Total Bilirubin: 0.8 mg/dL (ref 0.3–1.2)
Total Protein: 8 g/dL (ref 6.5–8.1)

## 2018-10-19 LAB — URINALYSIS, COMPLETE (UACMP) WITH MICROSCOPIC
BILIRUBIN URINE: NEGATIVE
Bacteria, UA: NONE SEEN
GLUCOSE, UA: NEGATIVE mg/dL
HGB URINE DIPSTICK: NEGATIVE
KETONES UR: NEGATIVE mg/dL
LEUKOCYTES UA: NEGATIVE
NITRITE: NEGATIVE
PROTEIN: NEGATIVE mg/dL
Specific Gravity, Urine: 1.025 (ref 1.005–1.030)
Squamous Epithelial / LPF: NONE SEEN (ref 0–5)
pH: 5 (ref 5.0–8.0)

## 2018-10-19 LAB — CBC
HCT: 50.8 % (ref 39.0–52.0)
Hemoglobin: 16.6 g/dL (ref 13.0–17.0)
MCH: 27.7 pg (ref 26.0–34.0)
MCHC: 32.7 g/dL (ref 30.0–36.0)
MCV: 84.8 fL (ref 80.0–100.0)
PLATELETS: 218 10*3/uL (ref 150–400)
RBC: 5.99 MIL/uL — ABNORMAL HIGH (ref 4.22–5.81)
RDW: 14.5 % (ref 11.5–15.5)
WBC: 8 10*3/uL (ref 4.0–10.5)
nRBC: 0 % (ref 0.0–0.2)

## 2018-10-19 MED ORDER — TAMSULOSIN HCL 0.4 MG PO CAPS: 0.4000 mg | ORAL_CAPSULE | Freq: Every day | ORAL | 0 refills | Status: DC

## 2018-10-19 MED ORDER — OXYCODONE-ACETAMINOPHEN 5-325 MG PO TABS: 1.0000 | ORAL_TABLET | ORAL | 0 refills | Status: AC | PRN

## 2018-10-19 NOTE — ED Triage Notes (Signed)
Pt in with co right flank pain radiates to testicles since this am. Denies any dysuria or hematuria.

## 2018-10-19 NOTE — ED Provider Notes (Signed)
[facility] Emergency Department Provider Note  ___________________________________________   First MD Initiated Contact with Patient 10/19/18 2044     (approximate)  I have reviewed the triage vital signs and the nursing notes.   HISTORY  Chief Complaint Flank Pain   HPI Benjamin Summers is a 42 y.o. male with a history of hypertension and hypercholesterolemia was presented with right lower quadrant abdominal pain.  The patient says that he has been having intermittent right lower quadrant abdominal pain off and on for several months.  He says the pain is sharp and severe and radiates to his right flank as well as the right testicle.  It is not exacerbated with movement.  Not reporting any nausea or vomiting or fever at this time.  Says that the pain was severe this morning and last for several hours.  No urinary complaints at this time.  Says that he also has been having moderate constipation and says that he sometimes has partial relief of the pain if he moves his bowels.   Past Medical History:  Diagnosis Date  . Depression   . Elevated cholesterol   . Hypertension     Patient Active Problem List   Diagnosis Date Noted  . Paresthesia of left arm and leg 01/01/2018  . Hypertension 01/01/2018  . Nevus of scalp 01/01/2018  . Anxiety 01/01/2018  . Headache 01/01/2018  . Annual physical exam 11/16/2015  . Obesity (BMI 35.0-39.9 without comorbidity) 11/16/2015      Prior to Admission medications   Medication Sig Start Date End Date Taking? Authorizing Provider  amLODipine (NORVASC) 10 MG tablet Take 1 tablet (10 mg total) by mouth daily. 02/19/18   Leone Haven, MD  hydrochlorothiazide (HYDRODIURIL) 12.5 MG tablet TAKE 1 TABLET BY MOUTH EVERY DAY 09/21/18   Leone Haven, MD    Allergies Patient has no known allergies.  Family History  Problem Relation Age of Onset  . Hypertension Mother   . Diabetes Mother     Social History Social  History   Tobacco Use  . Smoking status: Never Smoker  . Smokeless tobacco: Never Used  Substance Use Topics  . Alcohol use: Yes    Alcohol/week: 7.0 standard drinks    Types: 7 Standard drinks or equivalent per week  . Drug use: No    Review of Systems  Constitutional: No fever/chills Eyes: No visual changes. ENT: No sore throat. Cardiovascular: Denies chest pain. Respiratory: Denies shortness of breath. Gastrointestinal:  No nausea, no vomiting.  No diarrhea.  No constipation. Genitourinary: Negative for dysuria. Musculoskeletal: As above Skin: Negative for rash. Neurological: Negative for headaches, focal weakness or numbness.   ____________________________________________   PHYSICAL EXAM:  VITAL SIGNS: ED Triage Vitals [10/19/18 1903]  Enc Vitals Group     BP (!) 141/91     Pulse Rate 84     Resp 20     Temp 98.6 F (37 C)     Temp Source Oral     SpO2 98 %     Weight 240 lb (108.9 kg)     Height      Head Circumference      Peak Flow      Pain Score 1     Pain Loc      Pain Edu?      Excl. in [place]?     Constitutional: Alert and oriented. Well appearing and in no acute distress. Eyes: Conjunctivae are normal.  Head: Atraumatic. Nose: No  congestion/rhinnorhea. Mouth/Throat: Mucous membranes are moist.  Neck: No stridor.   Cardiovascular: Normal rate, regular rhythm. Grossly normal heart sounds Respiratory: Normal respiratory effort.  No retractions. Lungs CTAB. Gastrointestinal: Soft and nontender. No distention. No CVA tenderness. Musculoskeletal: No lower extremity tenderness nor edema.  No joint effusions. Neurologic:  Normal speech and language. No gross focal neurologic deficits are appreciated. Skin:  Skin is warm, dry and intact. No rash noted. Psychiatric: Mood and affect are normal. Speech and behavior are normal.  ____________________________________________   LABS (all labs ordered are listed, but only abnormal results are  displayed)  Labs Reviewed  CBC - Abnormal; Notable for the following components:      Result Value   RBC 5.99 (*)    All other components within normal limits  COMPREHENSIVE METABOLIC PANEL - Abnormal; Notable for the following components:   Glucose, Bld 109 (*)    BUN 21 (*)    Creatinine, Ser 1.68 (*)    GFR calc non Af Amer 49 (*)    GFR calc Af Amer 57 (*)    All other components within normal limits  URINALYSIS, COMPLETE (UACMP) WITH MICROSCOPIC - Abnormal; Notable for the following components:   Color, Urine YELLOW (*)    APPearance CLEAR (*)    All other components within normal limits   ____________________________________________  EKG   ____________________________________________  RADIOLOGY  CT renal with 4 x 3 mm calculus in the proximal right ureter with moderately severe hydronephrosis and proximal ureterectasis on the right. ____________________________________________   PROCEDURES  Procedure(s) performed:   Procedures  Critical Care performed:   ____________________________________________   INITIAL IMPRESSION / ASSESSMENT AND PLAN / ED COURSE  Pertinent labs & imaging results that were available during my care of the patient were reviewed by me and considered in my medical decision making (see chart for details).  Differential diagnosis includes, but is not limited to, acute appendicitis, renal colic, testicular torsion, urinary tract infection/pyelonephritis, prostatitis,  epididymitis, diverticulitis, small bowel obstruction or ileus, colitis, abdominal aortic aneurysm, gastroenteritis, hernia, etc. As part of my medical decision making, I reviewed the following data within the electronic MEDICAL RECORD NUMBER Notes from prior ED visits  ----------------------------------------- 10:08 PM on 10/19/2018 -----------------------------------------  Patient without any distress or planes of increased pain at this time.  Walking around the room without  issue.  I reviewed the imaging as well as the diagnosis with the patient as well as his wife.  I also discussed case Dr. Erlene Quan who would like the patient to be seen in clinic tomorrow.  Patient's MRI and contact information for Dr. Erlene Quan so that her office may contact the patient tomorrow.  He was also advised not to take any aspirin, ibuprofen or Aleve over the next several days as he cannot take anything that within his blood if he is going to have lithotripsy.  He is understanding of the diagnosis as well as treatment and willing to comply.  We also reviewed the renal function.  We discussed return precautions such as any worsening or concerning symptoms but specifically to watch out for increased abdominal pain, fever chills, nausea and vomiting.  ____________________________________________   FINAL CLINICAL IMPRESSION(S) / ED DIAGNOSES  Kidney stone.  NEW MEDICATIONS STARTED DURING THIS VISIT:  New Prescriptions   No medications on file     Note:  This document was prepared using Dragon voice recognition software and may include unintentional dictation errors.     Orbie Pyo, MD 10/19/18 2209

## 2018-10-20 ENCOUNTER — Ambulatory Visit
Admission: RE | Admit: 2018-10-20 | Discharge: 2018-10-20 | Disposition: A | Discharge status: Home / Self Care | Payer: BC Managed Care – PPO | Source: Ambulatory Visit | Attending: Urology | Admitting: Urology

## 2018-10-20 ENCOUNTER — Ambulatory Visit: Admission: RE | Admit: 2018-10-20 | Payer: BC Managed Care – PPO | Source: Ambulatory Visit | Admitting: *Deleted

## 2018-10-20 ENCOUNTER — Telehealth: Payer: Self-pay | Admitting: Urology

## 2018-10-20 ENCOUNTER — Encounter: Payer: Self-pay | Admitting: Urology

## 2018-10-20 ENCOUNTER — Ambulatory Visit: Payer: BC Managed Care – PPO | Admitting: Urology

## 2018-10-20 VITALS — BP 142/93 | HR 82 | Ht 70.0 in | Wt 239.9 lb

## 2018-10-20 DIAGNOSIS — Z09 Encounter for follow-up examination after completed treatment for conditions other than malignant neoplasm: Secondary | ICD-10-CM | POA: Diagnosis present

## 2018-10-20 DIAGNOSIS — Z87442 Personal history of urinary calculi: Secondary | ICD-10-CM | POA: Insufficient documentation

## 2018-10-20 DIAGNOSIS — N200 Calculus of kidney: Secondary | ICD-10-CM

## 2018-10-20 NOTE — Telephone Encounter (Signed)
-----   Message from Hollice Espy, MD sent at 10/20/2018  6:37 AM EST ----- Regarding: Seen today I got a call in the ER about this patient.  I think he needs to be seen today to consider getting him on the truck on Thursday.  He can see any provider where there is any opening today or cancellation.  If not, will come into the end of my day.   ----- Message ----- From: Orbie Pyo, MD Sent: 10/19/2018  10:11 PM EST To: Hollice Espy, MD  Dr. Erlene Quan,   Please see attached for ER patient we discussed for lithotripsy this Thursday.  4x67mm prox stone with renal insufficiency and kidney edema.    Thank you!!  Brandy Hale

## 2018-10-20 NOTE — Telephone Encounter (Signed)
App made and patient has been notified ° °Michelle °

## 2018-10-21 MED ORDER — ONDANSETRON HCL 4 MG PO TABS: 4.0000 mg | ORAL_TABLET | Freq: Three times a day (TID) | ORAL | 0 refills | Status: DC | PRN

## 2018-10-21 NOTE — Progress Notes (Signed)
10/20/2018 9:08 AM   Michaell Cowing Ronnald Ramp Jun 24, 1976 [id number]  Referring provider: Leone Haven, MD [address] [place], [place] [address]  CC: Right renal colic  HPI: I saw Mr. Zielke today in clinic after acute onset of nausea,right flank, and groin pain that started yesterday afternoon. He was seen in the ED last night, and found to have a 36m non-infected right proximal ureteral stone with mild to moderate upstream hydronephrosis. He is otherwise healthy aside from HTN. He had a slight decrease in his eGFR to 57 from 87 at baseline. Suspect secondary to nausea with decreased fluid intake. Urinalysis was non-infected. He reports his pain is well controlled on Norco currently.   He denies a prior history of stones. Denies fevers/chills, or dysuria. No aggravating factors. Severity moderate. Duration ~24 hours.   PMH: Past Medical History:  Diagnosis Date  . Depression   . Elevated cholesterol   . Hypertension     Surgical History: History reviewed. No pertinent surgical history.  Home Medications:  Allergies as of 10/20/2018   No Known Allergies     Medication List        Accurate as of 10/20/18 11:59 PM. Always use your most recent med list.          amLODipine 10 MG tablet Commonly known as:  NORVASC Take 1 tablet (10 mg total) by mouth daily.   hydrochlorothiazide 12.5 MG tablet Commonly known as:  HYDRODIURIL TAKE 1 TABLET BY MOUTH EVERY DAY   oxyCODONE-acetaminophen 5-325 MG tablet Commonly known as:  PERCOCET/ROXICET Take 1 tablet by mouth every 4 (four) hours as needed for severe pain.   tamsulosin 0.4 MG Caps capsule Commonly known as:  FLOMAX Take 1 capsule (0.4 mg total) by mouth daily.       Allergies: No Known Allergies  Family History: Family History  Problem Relation Age of Onset  . Hypertension Mother   . Diabetes Mother     Social History:  reports that he has never smoked. He has never used smokeless tobacco. He  reports that he drinks about 7.0 standard drinks of alcohol per week. He reports that he does not use drugs.  ROS: Please see flowsheet from today's date for complete review of systems.  Physical Exam: BP (!) 142/93 (BP Location: Left Arm, Patient Position: Sitting, Cuff Size: Normal)   Pulse 82   Ht _0  (1.778 m)   Wt 239 lb 14.4 oz (108.8 kg)   BMI 34.42 kg/m    Constitutional:  Alert and oriented, No acute distress. Cardiovascular: No clubbing, cyanosis, or edema. Respiratory: Normal respiratory effort, no increased work of breathing. GI: Abdomen is soft, nontender, nondistended, no abdominal masses GU: Mild right CVA tenderness Lymph: No cervical or inguinal lymphadenopathy. Skin: No rashes, bruises or suspicious lesions. Neurologic: Grossly intact, no focal deficits, moving all 4 extremities. Psychiatric: Normal mood and affect.  Laboratory Data: Reviewed  Pertinent Imaging: I have personally reviewed the CT stone protocol dated 11/11. Right 438mproximal ureteral stone with mild hydro.  Unable to visualize a stone on KUB today.  Assessment & Plan:   In summary, Mr. JoMuzquizs a healthy [age]ear old African American male with 24 hours of right flank pain and non-infected right 59m6mroximal ureteral stone seen on CT yesterday. Pain currently well controlled.  We discussed various treatment options for urolithiasis including observation with or without medical expulsive therapy, shockwave lithotripsy (SWL), ureteroscopy and laser lithotripsy with stent placement, and percutaneous nephrolithotomy.  We discussed that management is based on stone size, location, density, patient co-morbidities, and patient preference.   Stones <73m in size have a >80% spontaneous passage rate. Data surrounding the use of tamsulosin for medical expulsive therapy is controversial, but meta analyses suggests it is most efficacious for distal stones between 5-194min size. Possible side effects  include dizziness/lightheadedness, and retrograde ejaculation.  SWL has a lower stone free rate in a single procedure, but also a lower complication rate compared to ureteroscopy and avoids a stent and associated stent related symptoms. Possible complications include renal hematoma, steinstrasse, and need for additional treatment. He is not a candidate for SWL, as cannot see stone on KUB.  Ureteroscopy with laser lithotripsy and stent placement has a higher stone free rate than SWL in a single procedure, however increased complication rate including possible infection, ureteral injury, bleeding, and stent related morbidity. Common stent related symptoms include dysuria, urgency/frequency, and flank pain.  After an extensive discussion of the risks and benefits of the above treatment options, the patient would like to proceed with medical expulsive therapy. He will call our clinic next week if he is still having pain and wants to schedule right ureteroscopy/laser lithotripsy/stent.  Continue flomax, NSAIDs, narcotics. Zofran sent to pharmacy Call next week if persistent symptoms  BrBilley CoMDJuncos25[address] King DriveSuQuenemouCoquaNC [phone]

## 2019-04-27 ENCOUNTER — Other Ambulatory Visit: Payer: Self-pay | Admitting: Family Medicine

## 2019-08-03 ENCOUNTER — Other Ambulatory Visit: Payer: Self-pay | Admitting: Family Medicine

## 2019-08-27 ENCOUNTER — Other Ambulatory Visit: Payer: Self-pay | Admitting: Family Medicine

## 2019-09-27 ENCOUNTER — Other Ambulatory Visit: Payer: Self-pay | Admitting: Family Medicine

## 2019-10-16 ENCOUNTER — Other Ambulatory Visit: Payer: Self-pay | Admitting: Family Medicine

## 2019-10-20 ENCOUNTER — Ambulatory Visit (INDEPENDENT_AMBULATORY_CARE_PROVIDER_SITE_OTHER): Payer: BC Managed Care – PPO | Admitting: Family Medicine

## 2019-10-20 ENCOUNTER — Other Ambulatory Visit: Payer: Self-pay

## 2019-10-20 ENCOUNTER — Other Ambulatory Visit: Payer: Self-pay | Admitting: Family Medicine

## 2019-10-20 ENCOUNTER — Encounter: Payer: Self-pay | Admitting: Family Medicine

## 2019-10-20 DIAGNOSIS — E785 Hyperlipidemia, unspecified: Secondary | ICD-10-CM | POA: Diagnosis not present

## 2019-10-20 DIAGNOSIS — R202 Paresthesia of skin: Secondary | ICD-10-CM

## 2019-10-20 DIAGNOSIS — I10 Essential (primary) hypertension: Secondary | ICD-10-CM

## 2019-10-20 DIAGNOSIS — R7303 Prediabetes: Secondary | ICD-10-CM

## 2019-10-20 NOTE — Assessment & Plan Note (Signed)
This continues to be an issue though has improved in frequency.  I advised neurology evaluation and discussed that this could be related to a nerve impingement, MS, tumor, or some other cause.  I did discuss that a tumor would be less likely given the decrease in frequency.  He declined evaluation at this time and opted to monitor.  If he has increasing frequency he will let us know.

## 2019-10-20 NOTE — Assessment & Plan Note (Signed)
Check lipid panel  

## 2019-10-20 NOTE — Assessment & Plan Note (Signed)
Adequately controlled.  He will continue his current regimen.  We will refill.  He will come in on Friday for lab work.  Follow-up in 6 months.

## 2019-10-20 NOTE — Telephone Encounter (Signed)
Needs to be seen for a visit to receive a refill. I refused the medication.

## 2019-10-20 NOTE — Telephone Encounter (Signed)
Patient was seen today.  Nina,cma

## 2019-10-20 NOTE — Assessment & Plan Note (Signed)
Check A1c.  Encouraged continued activity and dietary changes.  I discussed being careful with the keto diet as it is quite easy to put the weight back on after discontinuing the diet and also to monitor fat intake so that it is not excessive with the keto diet.

## 2019-10-20 NOTE — Progress Notes (Signed)
Virtual Visit via video Note  This visit type was conducted due to national recommendations for restrictions regarding the COVID-19 pandemic (e.g. social distancing).  This format is felt to be most appropriate for this patient at this time.  All issues noted in this document were discussed and addressed.  No physical exam was performed (except for noted visual exam findings with Video Visits).   I connected with Benjamin Summers today at  2:00 PM EST by a video enabled telemedicine application and verified that I am speaking with the correct person using two identifiers. Location patient: home Location provider: work  Persons participating in the virtual visit: patient, provider  I discussed the limitations, risks, security and privacy concerns of performing an evaluation and management service by telephone and the availability of in person appointments. I also discussed with the patient that there may be a patient responsible charge related to this service. The patient expressed understanding and agreed to proceed.   Reason for visit: follow-up  HPI: HYPERTENSION  Disease Monitoring  Home BP Monitoring 137/89 Chest pain- no    Dyspnea- no Medications  Compliance-  Taking amlodipine, HCTZ.   Edema- no  Paresthesias: Patient notes continued issues with paresthesias in his left leg and arm.  He notes the leg portion is worse than the arm portion and notes both are quite a bit better than they were the last time I saw him over a year ago.  He notes the whole leg will tingle at times.  He defers evaluation for this previously.  Prediabetes: No polyuria or polydipsia.  He is going to start on a keto diet and he is cutting down on carbs and sugar.  Has been walking about 10,000 steps daily.     ROS: See pertinent positives and negatives per HPI.  Past Medical History:  Diagnosis Date  . Depression   . Elevated cholesterol   . Hypertension     No past surgical history on file.  Family  History  Problem Relation Age of Onset  . Hypertension Mother   . Diabetes Mother     SOCIAL HX: Non-smoker.   Current Outpatient Medications:  .  amLODipine (NORVASC) 10 MG tablet, TAKE 1 TABLET BY MOUTH EVERY DAY, Disp: 30 tablet, Rfl: 0 .  hydrochlorothiazide (HYDRODIURIL) 12.5 MG tablet, TAKE 1 TABLET BY MOUTH EVERY DAY, Disp: 30 tablet, Rfl: 0 .  tamsulosin (FLOMAX) 0.4 MG CAPS capsule, Take 1 capsule (0.4 mg total) by mouth daily., Disp: 30 capsule, Rfl: 0  EXAM:  VITALS per patient if applicable: BP 99991111  GENERAL: alert, oriented, appears well and in no acute distress  HEENT: atraumatic, conjunttiva clear, no obvious abnormalities on inspection of external nose and ears  NECK: normal movements of the head and neck  LUNGS: on inspection no signs of respiratory distress, breathing rate appears normal, no obvious gross SOB, gasping or wheezing  CV: no obvious cyanosis  MS: moves all visible extremities without noticeable abnormality  PSYCH/NEURO: pleasant and cooperative, no obvious depression or anxiety, speech and thought processing grossly intact  ASSESSMENT AND PLAN:  Discussed the following assessment and plan:  Hypertension Adequately controlled.  He will continue his current regimen.  We will refill.  He will come in on Friday for lab work.  Follow-up in 6 months.  Paresthesia of left arm and leg This continues to be an issue though has improved in frequency.  I advised neurology evaluation and discussed that this could be related to a nerve  impingement, MS, tumor, or some other cause.  I did discuss that a tumor would be less likely given the decrease in frequency.  He declined evaluation at this time and opted to monitor.  If he has increasing frequency he will let us know.  Prediabetes Check A1c.  Encouraged continued activity and dietary changes.  I discussed being careful with the keto diet as it is quite easy to put the weight back on after discontinuing  the diet and also to monitor fat intake so that it is not excessive with the keto diet.  Hyperlipidemia Check lipid panel.    I discussed the assessment and treatment plan with the patient. The patient was provided an opportunity to ask questions and all were answered. The patient agreed with the plan and demonstrated an understanding of the instructions.   The patient was advised to call back or seek an in-person evaluation if the symptoms worsen or if the condition fails to improve as anticipated.    [name], MD

## 2019-10-22 ENCOUNTER — Other Ambulatory Visit (INDEPENDENT_AMBULATORY_CARE_PROVIDER_SITE_OTHER): Payer: BC Managed Care – PPO

## 2019-10-22 ENCOUNTER — Other Ambulatory Visit: Payer: Self-pay

## 2019-10-22 DIAGNOSIS — E785 Hyperlipidemia, unspecified: Secondary | ICD-10-CM

## 2019-10-22 DIAGNOSIS — R7303 Prediabetes: Secondary | ICD-10-CM

## 2019-10-22 DIAGNOSIS — I10 Essential (primary) hypertension: Secondary | ICD-10-CM | POA: Diagnosis not present

## 2019-10-22 LAB — COMPREHENSIVE METABOLIC PANEL
ALT: 11 U/L (ref 0–53)
AST: 11 U/L (ref 0–37)
Albumin: 4.7 g/dL (ref 3.5–5.2)
Alkaline Phosphatase: 42 U/L (ref 39–117)
BUN: 16 mg/dL (ref 6–23)
CO2: 28 mEq/L (ref 19–32)
Calcium: 9.1 mg/dL (ref 8.4–10.5)
Chloride: 103 mEq/L (ref 96–112)
Creatinine, Ser: 1.24 mg/dL (ref 0.40–1.50)
GFR: 77.29 mL/min (ref >60.00)
Glucose, Bld: 86 mg/dL (ref 70–99)
Potassium: 4.2 mEq/L (ref 3.5–5.1)
Sodium: 139 mEq/L (ref 135–145)
Total Bilirubin: 0.5 mg/dL (ref 0.2–1.2)
Total Protein: 7.3 g/dL (ref 6.0–8.3)

## 2019-10-22 LAB — LIPID PANEL
Cholesterol: 277 mg/dL — ABNORMAL HIGH (ref 0–200)
HDL: 47.9 mg/dL (ref >39.00)
LDL Cholesterol: 215 mg/dL — ABNORMAL HIGH (ref 0–99)
NonHDL: 228.98
Total CHOL/HDL Ratio: 6
Triglycerides: 69 mg/dL (ref 0.0–149.0)
VLDL: 13.8 mg/dL (ref 0.0–40.0)

## 2019-10-22 LAB — HEMOGLOBIN A1C: Hgb A1c MFr Bld: 5.7 % (ref 4.6–6.5)

## 2019-10-26 ENCOUNTER — Telehealth: Payer: Self-pay

## 2019-10-26 ENCOUNTER — Telehealth: Payer: Self-pay | Admitting: Family Medicine

## 2019-10-26 DIAGNOSIS — E785 Hyperlipidemia, unspecified: Secondary | ICD-10-CM

## 2019-10-26 MED ORDER — ROSUVASTATIN CALCIUM 40 MG PO TABS: 40.0000 mg | ORAL_TABLET | Freq: Every day | ORAL | 1 refills | Status: DC

## 2019-10-26 NOTE — Telephone Encounter (Signed)
Pt given lab results per notes of Dr Caryl Bis on 10/22/2019. Pt verbalized understanding. Patient requesting generic medication for Crestor. Lab appointment scheduled.

## 2019-10-26 NOTE — Telephone Encounter (Signed)
Medication ordered and labs ordered. Nina,cma

## 2019-10-26 NOTE — Telephone Encounter (Signed)
Labs ordered and medication was sent to the pharmacy.  Nina,cma

## 2019-11-17 ENCOUNTER — Other Ambulatory Visit: Payer: Self-pay | Admitting: Family Medicine

## 2019-11-18 ENCOUNTER — Other Ambulatory Visit: Payer: Self-pay | Admitting: Family Medicine

## 2019-12-06 ENCOUNTER — Other Ambulatory Visit (INDEPENDENT_AMBULATORY_CARE_PROVIDER_SITE_OTHER): Payer: BC Managed Care – PPO

## 2019-12-06 ENCOUNTER — Other Ambulatory Visit: Payer: Self-pay

## 2019-12-06 DIAGNOSIS — E785 Hyperlipidemia, unspecified: Secondary | ICD-10-CM | POA: Diagnosis not present

## 2019-12-06 LAB — LIPID PANEL
Cholesterol: 177 mg/dL (ref 0–200)
HDL: 52.6 mg/dL (ref >39.00)
LDL Cholesterol: 112 mg/dL — ABNORMAL HIGH (ref 0–99)
NonHDL: 124.64
Total CHOL/HDL Ratio: 3
Triglycerides: 65 mg/dL (ref 0.0–149.0)
VLDL: 13 mg/dL (ref 0.0–40.0)

## 2019-12-06 LAB — HEPATIC FUNCTION PANEL
ALT: 21 U/L (ref 0–53)
AST: 18 U/L (ref 0–37)
Albumin: 4.5 g/dL (ref 3.5–5.2)
Alkaline Phosphatase: 43 U/L (ref 39–117)
Bilirubin, Direct: 0.1 mg/dL (ref 0.0–0.3)
Total Bilirubin: 0.5 mg/dL (ref 0.2–1.2)
Total Protein: 7.6 g/dL (ref 6.0–8.3)

## 2019-12-10 ENCOUNTER — Other Ambulatory Visit: Payer: Self-pay | Admitting: Family Medicine

## 2020-01-08 ENCOUNTER — Other Ambulatory Visit: Payer: Self-pay | Admitting: Family Medicine

## 2020-01-19 IMAGING — CT CT RENAL STONE PROTOCOL
2 of 4 series · 15 of 46 positions shown, 17 images · non-contrast
Comparison: None.

CLINICAL DATA: Right flank pain

EXAM:
CT ABDOMEN AND PELVIS WITHOUT CONTRAST
TECHNIQUE: Multidetector CT imaging of the abdomen and pelvis was performed
following the standard protocol without oral or IV contrast.

[Series 2: stone full standard · axial · 0.78mm/px · z∈[+290,+724]mm · 12 of 95 slices shown, 14 images]
[im 4/95  soft-tissue]
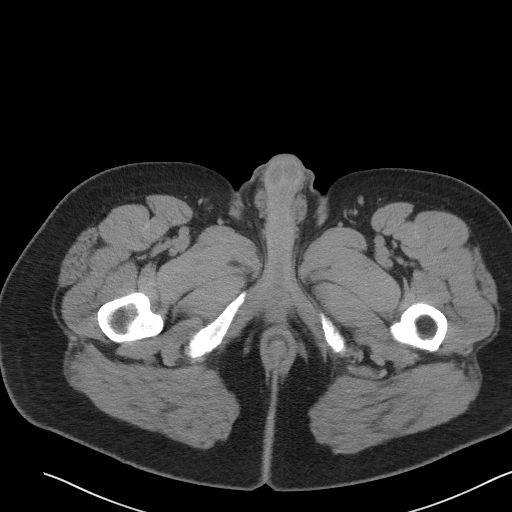
[im 4/95  bone]
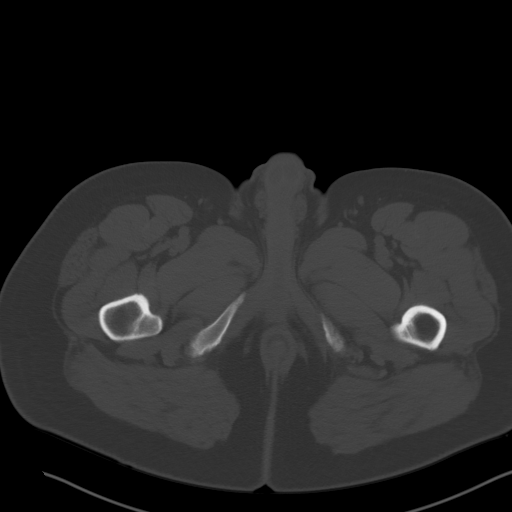
[im 12/95  soft-tissue]
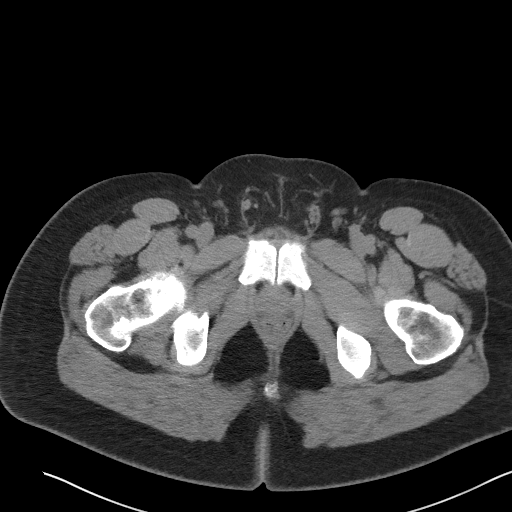
[im 20/95  soft-tissue]
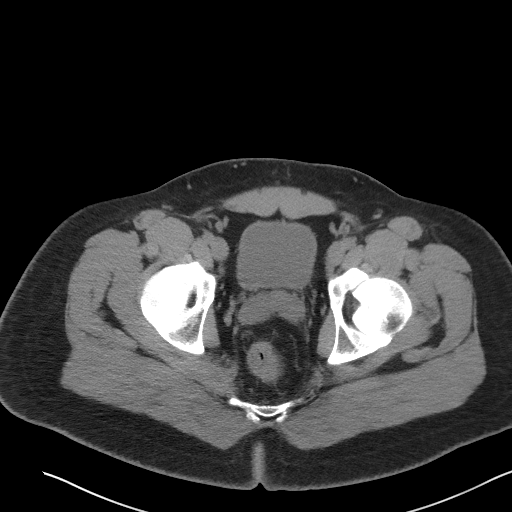
[im 28/95  soft-tissue]
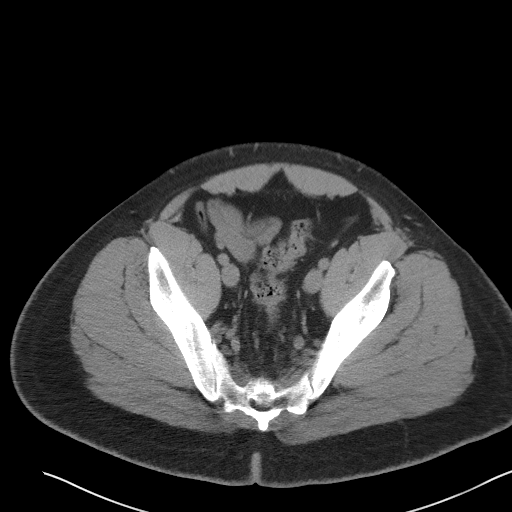
[im 36/95  soft-tissue]
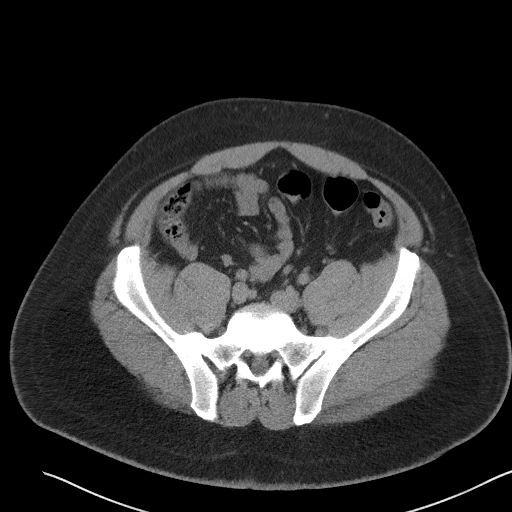
[im 44/95  soft-tissue]
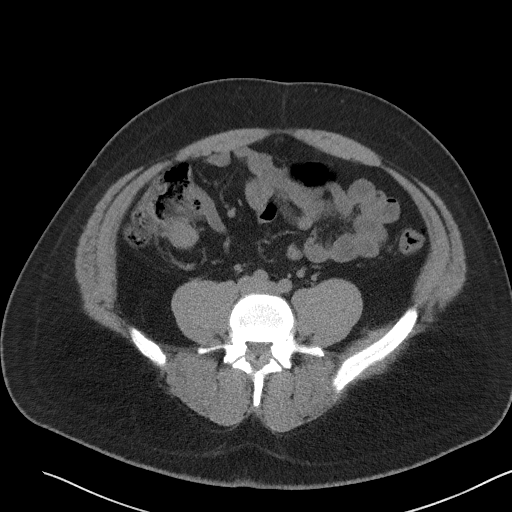
[im 51/95  soft-tissue]
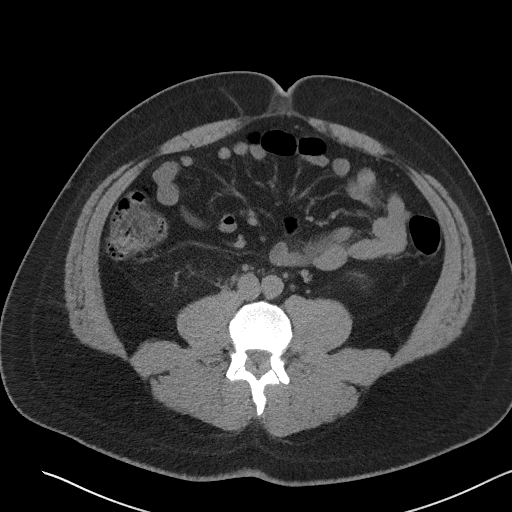
[im 59/95  soft-tissue]
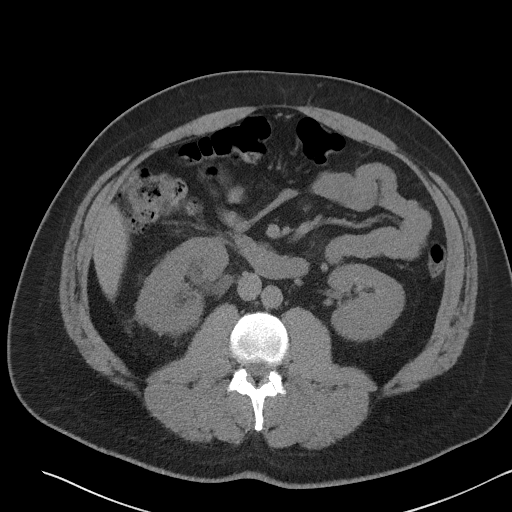
[im 67/95  soft-tissue]
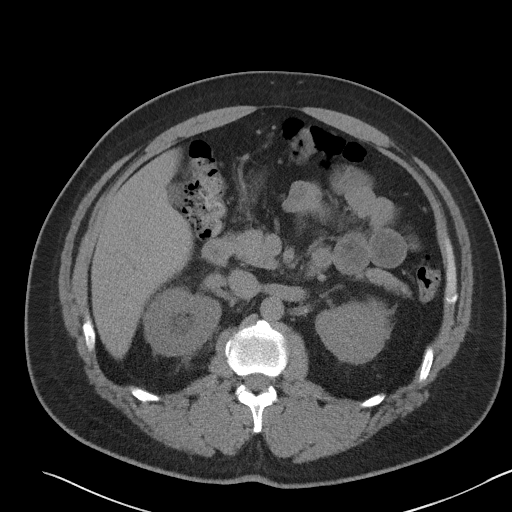
[im 67/95  bone]
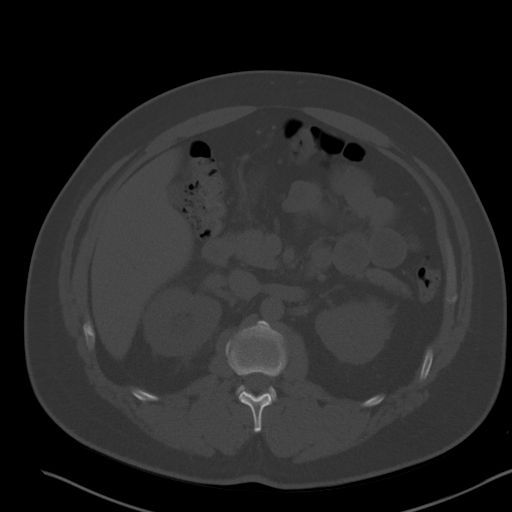
[im 75/95  soft-tissue]
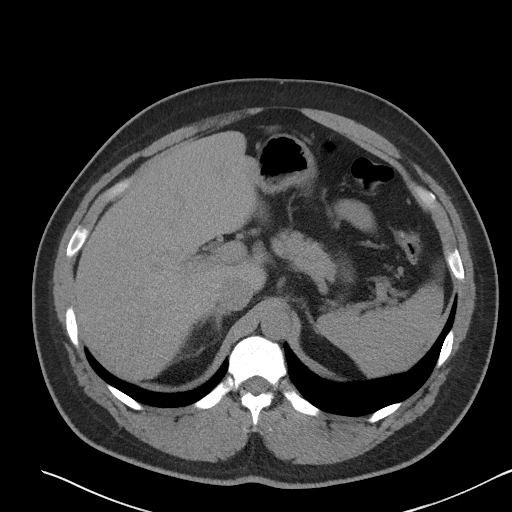
[im 83/95  soft-tissue]
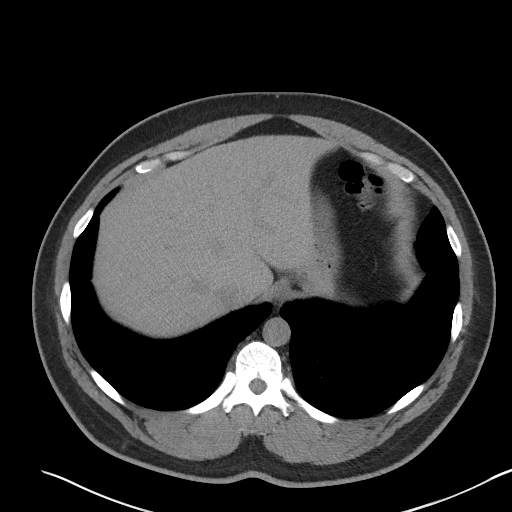
[im 91/95  soft-tissue]
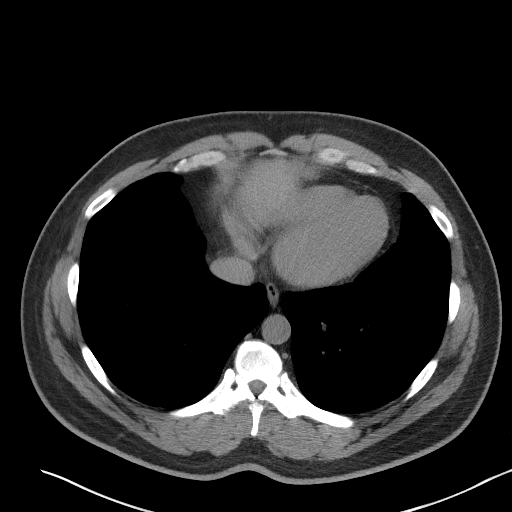

[Series 5: coronal · coronal · 0.74mm/px · 3 of 158 slices shown]
[im 53/158  soft-tissue]
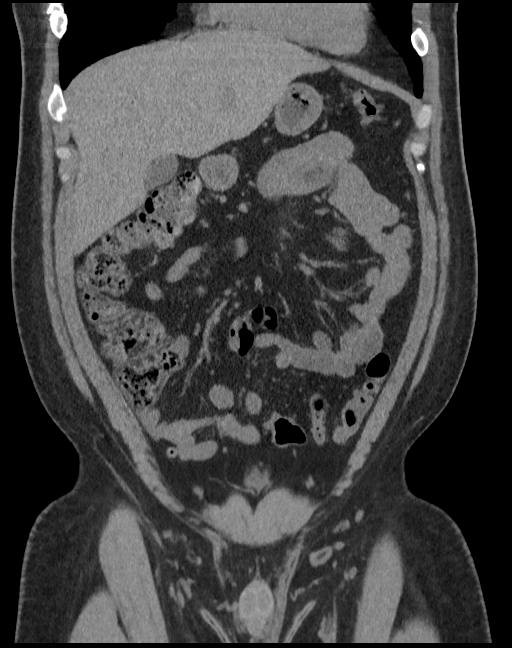
[im 70/158  soft-tissue]
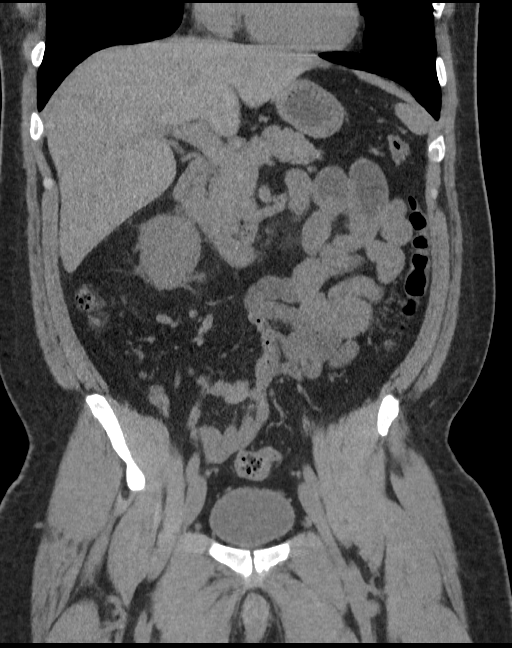
[im 88/158  soft-tissue]
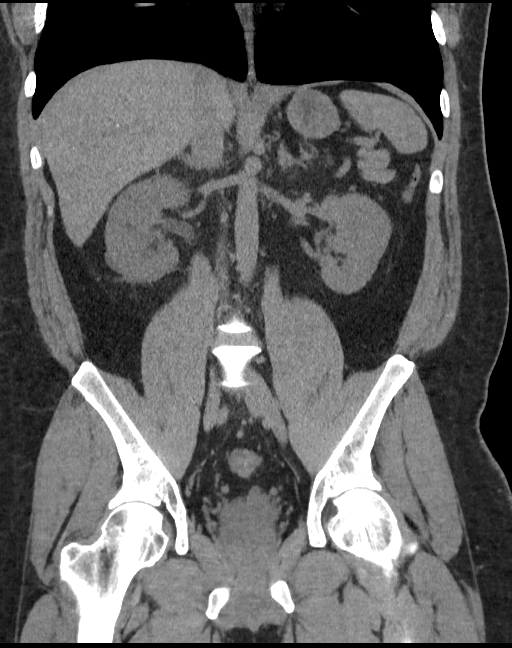

[15 of 46 positions shown; findings below may reference images not displayed]

FINDINGS: Lower chest: Lung bases are clear.

Hepatobiliary: No focal liver lesions are appreciable on this
noncontrast enhanced study. Gallbladder wall is not appreciably
thickened. There is no biliary duct dilatation.

Pancreas: There is no evident pancreatic mass or inflammatory focus.

Spleen: No splenic lesions are evident.

Adrenals/Urinary Tract: Adrenals bilaterally appear unremarkable.
Right kidney is edematous. There is no evident renal mass on either
side. There is moderately severe hydronephrosis on the right. There
is no appreciable hydronephrosis on the left. There is a 1 mm
calculus in the mid left kidney. There is no intrarenal calculus on
the right. There is a 4 x 3 mm calculus in the proximal right ureter
at the L3 level. No other ureteral calculi evident. Urinary bladder
is midline with wall thickness within normal limits.

Stomach/Bowel: There is mild wall thickening involving several loops
of jejunum. There is no evident bowel obstruction. No free air or
portal venous air.

Vascular/Lymphatic: There is no appreciable abdominal aortic
aneurysm. No vascular lesions are evident on this noncontrast
enhanced study. There is no adenopathy in the abdomen or pelvis.

Reproductive: Prostate and seminal vesicles are normal in size and
contour. No evident pelvic mass.

Other: Appendix appears normal. There is no abscess or ascites in
the abdomen or pelvis. There is a small ventral hernia containing
only fat.

Musculoskeletal: There are no blastic or lytic bone lesions. There
is no intramuscular lesion evident.
IMPRESSION: 1. 4 x 3 mm calculus in the proximal right ureter with moderately
severe hydronephrosis and proximal ureterectasis on the right. Right
kidney is edematous.

2.  Nonobstructing 1 mm calculus mid left kidney.

3. Mild wall thickening involving several loops of jejunum. Suspect
a degree of enteritis. No evident bowel obstruction.

4.  No abscess in the abdomen or pelvis.  Appendix appears normal.

5.  Small ventral hernia containing only fat.

## 2020-02-06 ENCOUNTER — Other Ambulatory Visit: Payer: Self-pay | Admitting: Family Medicine

## 2020-03-07 ENCOUNTER — Other Ambulatory Visit: Payer: Self-pay | Admitting: Family Medicine

## 2020-03-17 ENCOUNTER — Ambulatory Visit: Payer: BC Managed Care – PPO | Attending: Internal Medicine

## 2020-03-17 DIAGNOSIS — Z23 Encounter for immunization: Secondary | ICD-10-CM

## 2020-03-17 NOTE — Progress Notes (Signed)
   Covid-19 Vaccination Clinic  Name:  Kyair Voloshin    MRN: [id number] DOB: May 04, 1976  03/17/2020  Mr. Loeber was observed post Covid-19 immunization for 15 minutes without incident. He was provided with Vaccine Information Sheet and instruction to access the V-Safe system.   Mr. Fousek was instructed to call 911 with any severe reactions post vaccine: Marland Kitchen Difficulty breathing  . Swelling of face and throat  . A fast heartbeat  . A bad rash all over body  . Dizziness and weakness   Immunizations Administered    Name Date Dose VIS Date Route   Pfizer COVID-19 Vaccine 03/17/2020  9:16 AM 0.3 mL 11/19/2019 Intramuscular   Manufacturer: [employer]   Lot: [id number]   [place]: [id number]

## 2020-04-03 ENCOUNTER — Ambulatory Visit: Payer: BC Managed Care – PPO | Admitting: Urology

## 2020-04-03 ENCOUNTER — Other Ambulatory Visit: Payer: Self-pay

## 2020-04-03 VITALS — BP 124/86 | HR 93 | Ht 70.0 in | Wt 255.0 lb

## 2020-04-03 DIAGNOSIS — N200 Calculus of kidney: Secondary | ICD-10-CM | POA: Diagnosis not present

## 2020-04-03 DIAGNOSIS — Z3009 Encounter for other general counseling and advice on contraception: Secondary | ICD-10-CM | POA: Diagnosis not present

## 2020-04-03 MED ORDER — DIAZEPAM 5 MG PO TABS: 5.0000 mg | ORAL_TABLET | Freq: Once | ORAL | 0 refills | Status: DC | PRN

## 2020-04-03 NOTE — Progress Notes (Signed)
   04/03/2020 10:58 AM   Michaell Cowing Ronnald Ramp 1976/11/07 [id number]  Reason for visit: Discuss vasectomy  HPI: I saw Mr. Pezzullo in urology clinic today for evaluation of vasectomy.  I previously saw him in 2019 for a 4 mm right ureteral stone that he ultimately passed spontaneously.  He and his wife are interested in vasectomy for permanent sterilization.  He has one 43 year old child.  He denies any urinary symptoms or trouble with erections.  He denies any gross hematuria.  There is no family history of prostate cancer.   Physical Exam: BP 124/86   Pulse 93   Ht 5\' 10"  (1.778 m)   Wt 255 lb (115.7 kg)   BMI 36.59 kg/m    Constitutional:  Alert and oriented, No acute distress. GU: Circumcised phallus, testicles 20 cc and descended, vas deferens easily palpable bilaterally  Laboratory Data: None to review  Assessment & Plan:   In summary, he is a 44 year old male with a history of nephrolithiasis who is interested in pursuing vasectomy for sterilization.  We discussed the risks and benefits of vasectomy at length.  Vasectomy is intended to be a permanent form of contraception, and does not produce immediate sterility.  Following vasectomy another form of contraception is required until vas occlusion is confirmed by a post-vasectomy semen analysis obtained 2-3 months after the procedure.  Even after vas occlusion is confirmed, vasectomy is not 100% reliable in preventing pregnancy, and the failure rate is approximately 12/1998.  Repeat vasectomy is required in less than 1% of patients.  He should refrain from ejaculation for 1 week after vasectomy.  Options for fertility after vasectomy include vasectomy reversal, and sperm retrieval with in vitro fertilization or ICSI.  These options are not always successful and may be expensive.  Finally, there are other permanent and non-permanent alternatives to vasectomy available. There is no risk of erectile dysfunction, and the volume of semen will be  similar to prior, as the majority of the ejaculate is from the prostate and seminal vesicles.   The procedure takes ~20 minutes.  We recommend patients take 5-10 mg of Valium 30 minutes prior, and he will need a driver post-procedure.  Local anesthetic is injected into the scrotal skin and a small segment of the vas deferens is removed, and the ends occluded. The complication rate is approximately 1-2%, and includes bleeding, infection, and development of chronic scrotal pain.  PLAN: Pending insurance approval, will schedule for vasectomy at his convenience   I spent 30 total minutes on the day of the encounter including pre-visit review of the medical record, face-to-face time with the patient, and post visit ordering of labs/imaging/tests.  Billey Co, [facility] [address] [place], [place] [address] [phone]

## 2020-04-03 NOTE — Patient Instructions (Signed)

## 2020-04-04 ENCOUNTER — Other Ambulatory Visit: Payer: Self-pay | Admitting: Family Medicine

## 2020-04-07 ENCOUNTER — Telehealth: Payer: Self-pay | Admitting: Family Medicine

## 2020-04-07 MED ORDER — HYDROCHLOROTHIAZIDE 12.5 MG PO TABS: 12.5000 mg | ORAL_TABLET | Freq: Every day | ORAL | 1 refills | Status: DC

## 2020-04-07 NOTE — Addendum Note (Signed)
Addended by: Elpidio Galea T on: 04/07/2020 04:41 PM   Modules accepted: Orders

## 2020-04-07 NOTE — Telephone Encounter (Signed)
Pt needs a refill on hydrochlorothiazide (HYDRODIURIL) 12.5 MG tablet sent to [facility]  

## 2020-04-16 ENCOUNTER — Other Ambulatory Visit: Payer: Self-pay | Admitting: Family Medicine

## 2020-04-16 DIAGNOSIS — E785 Hyperlipidemia, unspecified: Secondary | ICD-10-CM

## 2020-04-19 ENCOUNTER — Ambulatory Visit (INDEPENDENT_AMBULATORY_CARE_PROVIDER_SITE_OTHER): Payer: BC Managed Care – PPO | Admitting: Family Medicine

## 2020-04-19 ENCOUNTER — Encounter: Payer: Self-pay | Admitting: Family Medicine

## 2020-04-19 ENCOUNTER — Other Ambulatory Visit: Payer: Self-pay

## 2020-04-19 DIAGNOSIS — I10 Essential (primary) hypertension: Secondary | ICD-10-CM | POA: Diagnosis not present

## 2020-04-19 DIAGNOSIS — F419 Anxiety disorder, unspecified: Secondary | ICD-10-CM

## 2020-04-19 DIAGNOSIS — E785 Hyperlipidemia, unspecified: Secondary | ICD-10-CM | POA: Diagnosis not present

## 2020-04-19 MED ORDER — HYDROCHLOROTHIAZIDE 25 MG PO TABS: 25.0000 mg | ORAL_TABLET | Freq: Every day | ORAL | 1 refills | Status: DC

## 2020-04-19 NOTE — Progress Notes (Signed)
[name], MD Phone: [phone]  Benjamin Summers is a 44 y.o. male who presents today for follow-up.  Hypertension: Checks occasionally at work.  Typically in the 140/85 range.  Taking amlodipine and HCTZ.  No chest pain, shortness of breath, or edema.  Stress/anxiety/erectile dysfunction: Patient notes he has been having trouble getting and maintaining an erection.  He is able to ejaculate when he does get an erection.  He notes this started around the time that he developed stress and anxiety related to Covid.  He does get morning erections.  He did note some prior depression with Covid though no SI.  He has been seeing a physician for his Vyvanse and they started him on Prozac.  He notes that has helped some.  He has been on it for a couple of months.  Notes anxiety comes and goes.  Hyperlipidemia: Taking Crestor.  No right upper quadrant pain.  No myalgias.  Social History   Tobacco Use  Smoking Status Never Smoker  Smokeless Tobacco Never Used     ROS see history of present illness  Objective  Physical Exam Vitals:   04/19/20 1421 04/19/20 1438  BP: (!) 150/90 140/80  Pulse: 85   Temp: (!) 97.1 F (36.2 C)   SpO2: 97%     BP Readings from Last 3 Encounters:  04/19/20 140/80  04/03/20 124/86  10/20/18 (!) 142/93   Wt Readings from Last 3 Encounters:  04/19/20 229 lb 9.6 oz (104.1 kg)  04/03/20 255 lb (115.7 kg)  10/20/19 250 lb (113.4 kg)    Physical Exam Constitutional:      General: He is not in acute distress.    Appearance: He is not diaphoretic.  Cardiovascular:     Rate and Rhythm: Normal rate and regular rhythm.     Heart sounds: Normal heart sounds.  Pulmonary:     Effort: Pulmonary effort is normal.     Breath sounds: Normal breath sounds.  Genitourinary:    Comments: Normal circumcised penis, normal scrotum, normal testicles, normal vas deferens Skin:    General: Skin is warm and dry.  Neurological:     Mental Status: He is alert.       Assessment/Plan: Please see individual problem list.  Hypertension Above goal.  We will increase his HCTZ.  Lab work today and in 1 month.  Hyperlipidemia Continue Crestor.  Anxiety Somewhat improved on Prozac.  I suspect the anxiety and stress is causing his erectile issues.  Discussed that it is reassuring that he is getting an erection while asleep and that would indicate more of a psychological aspect.  He has a benign genital exam.  Offered increasing his Prozac versus referral to a therapist though he declined these and wanted to have a prescription for a phosphodiesterase inhibitor to treat the erectile dysfunction.  I discussed that is not an appropriate treatment given that his issues are more likely psychologically mediated.  He noted he would consider whether or not to increase the medicine or see a therapist.   Orders Placed This Encounter  Procedures  . Basic Metabolic Panel (BMET)    Standing Status:   Future    Standing Expiration Date:   04/19/2021  . Basic Metabolic Panel (BMET)    Meds ordered this encounter  Medications  . hydrochlorothiazide (HYDRODIURIL) 25 MG tablet    Sig: Take 1 tablet (25 mg total) by mouth daily.    Dispense:  90 tablet    Refill:  1  This visit occurred during the SARS-CoV-2 public health emergency.  Safety protocols were in place, including screening questions prior to the visit, additional usage of staff PPE, and extensive cleaning of exam room while observing appropriate contact time as indicated for disinfecting solutions.    Marisabel Macpherson, MD [facility] [place]  

## 2020-04-19 NOTE — Patient Instructions (Signed)
Nice to see you. You can increase your hydrochlorothiazide. We will get labs today and in 1 month. Please consider either increasing her Prozac dose or talking to a therapist.

## 2020-04-19 NOTE — Assessment & Plan Note (Signed)
Somewhat improved on Prozac.  I suspect the anxiety and stress is causing his erectile issues.  Discussed that it is reassuring that he is getting an erection while asleep and that would indicate more of a psychological aspect.  He has a benign genital exam.  Offered increasing his Prozac versus referral to a therapist though he declined these and wanted to have a prescription for a phosphodiesterase inhibitor to treat the erectile dysfunction.  I discussed that is not an appropriate treatment given that his issues are more likely psychologically mediated.  He noted he would consider whether or not to increase the medicine or see a therapist.

## 2020-04-19 NOTE — Assessment & Plan Note (Addendum)
Continue Crestor 

## 2020-04-19 NOTE — Assessment & Plan Note (Signed)
Above goal.  We will increase his HCTZ.  Lab work today and in 1 month.

## 2020-04-20 LAB — BASIC METABOLIC PANEL
BUN: 16 mg/dL (ref 6–23)
CO2: 29 mEq/L (ref 19–32)
Calcium: 9.2 mg/dL (ref 8.4–10.5)
Chloride: 100 mEq/L (ref 96–112)
Creatinine, Ser: 1.15 mg/dL (ref 0.40–1.50)
GFR: 84.11 mL/min (ref >60.00)
Glucose, Bld: 95 mg/dL (ref 70–99)
Potassium: 3.8 mEq/L (ref 3.5–5.1)
Sodium: 137 mEq/L (ref 135–145)

## 2020-04-26 ENCOUNTER — Encounter: Payer: Self-pay | Admitting: Urology

## 2020-04-27 ENCOUNTER — Other Ambulatory Visit: Payer: Self-pay | Admitting: Family Medicine

## 2020-04-27 ENCOUNTER — Other Ambulatory Visit: Payer: Self-pay

## 2020-04-27 ENCOUNTER — Ambulatory Visit: Payer: BC Managed Care – PPO | Admitting: Urology

## 2020-04-27 ENCOUNTER — Encounter: Payer: Self-pay | Admitting: Urology

## 2020-04-27 VITALS — BP 133/88 | HR 89 | Ht 70.0 in | Wt 229.0 lb

## 2020-04-27 DIAGNOSIS — Z302 Encounter for sterilization: Secondary | ICD-10-CM | POA: Diagnosis not present

## 2020-04-27 NOTE — Patient Instructions (Signed)
Vasectomy, Care After  This sheet gives you information about how to care for yourself after your procedure. Your health care provider may also give you more specific instructions. If you have problems or questions, contact your health care provider.  What can I expect after the procedure?  After your procedure, it is common to have:   Mild pain, swelling, redness, or discomfort in your scrotum.   Some blood coming from your incisions or puncture sites for one or two days.   Blood in your semen.  Follow these instructions at home:  Medicines     Take over-the-counter and prescription medicines only as told by your health care provider.   Avoid taking NSAIDs such as aspirin and ibuprofen, because these medicines can make bleeding worse.  Activity   For the first 2 days after surgery, avoid physical activity and exercise that require a lot of energy. Ask your health care provider what activities are safe for you.   Do not participate in sports or perform heavy physical labor until your pain has improved, or until your health care provider says it is okay.   Do not ejaculate for at least 1 week after the procedure, or as long as directed.   You may resume sexual activity 7-10 days after your procedure, or when your health care provider approves. Use a different method of birth control (contraception) until you have had test results that confirm that there is no sperm in your semen.  Scrotal support   Use scrotal support, such as a jock strap or underwear with a supportive pouch, as needed for one week after your procedure.   If you feel discomfort in your scrotum, you may remove the scrotal support to see if the discomfort is relieved. Sometimes scrotal support can press on the scrotum and cause or worsen discomfort.   If your skin gets irritated, you may add some germ-free (sterile), fluffed bandages or a clean washcloth to the scrotal support.  General instructions   Put ice on the injured area:  ? Put  ice in a plastic bag.  ? Place a towel between your skin and the bag.  ? Leave the ice on for 20 minutes, 2-3 times a day.   Check your incisions or puncture sites every day for signs of infection. Check for:  ? Redness, swelling, or pain.  ? Fluid or blood.  ? Warmth.  ? Pus or a bad smell.   Leave stitches (sutures) in place. The sutures will dissolve on their own and do not need to be removed.   Keep all follow-up visits as told by your health care provider. This is important because you will need a test to confirm that there is no sperm in your semen. Multiple ejaculations are needed to clear out sperm that were beyond the vasectomy site. You will need one test result showing that there is no sperm in your semen before you can resume unprotected sex. This may take 2-4 months after your procedure.   Do not drive for 24 hours if you were given a sedative to help you relax.  Contact a health care provider if:   You have redness, swelling, or more pain around your incision or puncture site, or in your scrotum area in general.   You have bleeding from your incision or puncture site.   You have pus or a bad smell coming from your incision or puncture site.   You have a fever.   Your incision or   Avoid physical activity and exercise that requires a lot of energy for the first 2 days after surgery.  Put ice on the injured area. Leave the ice on for 20 minutes, 2-3 times a day.  Do not drive for 24 hours if you were given a sedative to help you relax. This information is not intended to replace advice given to you by your health care provider. Make sure you discuss any questions you have with your health care provider. Document Revised: 11/07/2017 Document Reviewed: 02/21/2017 Elsevier  Patient Education  [place].

## 2020-04-27 NOTE — Progress Notes (Signed)
VASECTOMY PROCEDURE NOTE:  The patient was taken to the minor procedure room and placed in the supine position. His genitals were prepped and draped in the usual sterile fashion. The right vas deferens was brought up to the skin of the right upper scrotum. The skin overlying it was anesthetized with 1% lidocaine without epinephrine, anesthetic was also injected alongside the vas deferens in the direction of the inguinal canal. The no scalpel vasectomy instrument was used to make a small perforation in the scrotal skin. The vasectomy clamp was used to grasp the vas deferens. It was carefully dissected free from surrounding structures. A 1cm segment of the vas was removed, and the cut ends of the mucosa were cauterized. A figure of eight suture was used to perform fascial interposition. No significant bleeding was noted. The vas deferens was returned to the scrotum. The skin incision was closed with a simple interrupted stitch of 4-0 chromic.  Attention was then turned to the left side. The left vasectomy was performed in the same exact fashion. Sterile dressings were placed over each incision. The patient tolerated the procedure well.  IMPRESSION/DIAGNOSIS: The patient is a 44 year old gentleman who underwent a vasectomy today. Post-procedure instructions were reviewed. I stressed the importance of continuing to use birth control until he provides a semen specimen more than 2 months from now that demonstrates azoospermia.  We discussed return precautions including fever over 101, significant bleeding or hematoma, or uncontrolled pain. I also stressed the importance of avoiding strenuous activity for one week, no sexual activity or ejaculations for 5 days, intermittent icing over the next 48 hours, and scrotal support.   PLAN: The patient will be advised of his semen analysis results when available.   Benjamin Madrid, MD 04/27/2020

## 2020-05-24 ENCOUNTER — Other Ambulatory Visit: Payer: Self-pay | Admitting: Family Medicine

## 2020-05-24 ENCOUNTER — Other Ambulatory Visit (INDEPENDENT_AMBULATORY_CARE_PROVIDER_SITE_OTHER): Payer: BC Managed Care – PPO

## 2020-05-24 ENCOUNTER — Encounter: Payer: Self-pay | Admitting: Family Medicine

## 2020-05-24 ENCOUNTER — Ambulatory Visit (INDEPENDENT_AMBULATORY_CARE_PROVIDER_SITE_OTHER): Payer: BC Managed Care – PPO

## 2020-05-24 ENCOUNTER — Other Ambulatory Visit: Payer: Self-pay

## 2020-05-24 VITALS — BP 120/81 | HR 86

## 2020-05-24 DIAGNOSIS — I10 Essential (primary) hypertension: Secondary | ICD-10-CM

## 2020-05-24 LAB — BASIC METABOLIC PANEL
BUN: 16 mg/dL (ref 6–23)
CO2: 27 mEq/L (ref 19–32)
Calcium: 9.5 mg/dL (ref 8.4–10.5)
Chloride: 99 mEq/L (ref 96–112)
Creatinine, Ser: 1.22 mg/dL (ref 0.40–1.50)
GFR: 78.53 mL/min (ref >60.00)
Glucose, Bld: 99 mg/dL (ref 70–99)
Potassium: 3.4 mEq/L — ABNORMAL LOW (ref 3.5–5.1)
Sodium: 138 mEq/L (ref 135–145)

## 2020-05-24 MED ORDER — SILDENAFIL CITRATE 50 MG PO TABS: 50.0000 mg | ORAL_TABLET | Freq: Every day | ORAL | 0 refills | Status: DC | PRN

## 2020-05-24 NOTE — Progress Notes (Signed)
Patient stated pharmacy stated his pharmacy wont cover it. They were looking for alternative.

## 2020-05-24 NOTE — Addendum Note (Signed)
Addended by: Leone Haven on: 05/24/2020 04:21 PM   Modules accepted: Orders

## 2020-05-24 NOTE — Progress Notes (Signed)
Patient is here for a BP check due to bp being high at last visit and medication was increased, as per patient.  Currently patients BP is 120/81 and BPM is 86.  Patient has no complaints of headaches, blurry vision, chest pain, arm pain, light headedness, dizziness, and nor jaw pain. Please see previous note for order.   Patient also wanted to let provider know that he is wanting Viagra for his ED. His penis is not becoming erect at all like before. Please advise.

## 2020-05-25 ENCOUNTER — Telehealth: Payer: Self-pay

## 2020-05-25 NOTE — Telephone Encounter (Signed)
-----   Message from Leone Haven, MD sent at 05/24/2020 12:02 PM EDT ----- Please let the patient know that his potassium is mildly low.  He needs to increase potassium rich foods in his diet and have this rechecked in 2 weeks.  This potentially could be low related to the hydrochlorothiazide and if it lowers any further we may need to consider a different blood pressure medicine.

## 2020-05-25 NOTE — Telephone Encounter (Signed)
Called patient to schedule for repeat labs in 2 weeks. Benjamin Summers said he will check his calender and call back to schedule since he is going out of town.

## 2020-06-01 ENCOUNTER — Other Ambulatory Visit: Payer: Self-pay | Admitting: Family Medicine

## 2020-06-24 ENCOUNTER — Other Ambulatory Visit: Payer: Self-pay | Admitting: Family Medicine

## 2020-07-18 ENCOUNTER — Other Ambulatory Visit: Payer: Self-pay | Admitting: Family Medicine

## 2020-07-19 ENCOUNTER — Encounter: Payer: Self-pay | Admitting: Family Medicine

## 2020-07-19 MED ORDER — SILDENAFIL CITRATE 50 MG PO TABS: 50.0000 mg | ORAL_TABLET | Freq: Every day | ORAL | 0 refills | Status: DC | PRN

## 2020-07-26 ENCOUNTER — Other Ambulatory Visit: Payer: Self-pay

## 2020-07-26 ENCOUNTER — Telehealth: Payer: BC Managed Care – PPO | Admitting: Family Medicine

## 2020-07-26 DIAGNOSIS — Z0289 Encounter for other administrative examinations: Secondary | ICD-10-CM

## 2020-07-28 ENCOUNTER — Other Ambulatory Visit: Payer: Self-pay

## 2020-08-03 ENCOUNTER — Other Ambulatory Visit: Payer: BC Managed Care – PPO

## 2020-08-03 ENCOUNTER — Other Ambulatory Visit: Payer: Self-pay

## 2020-08-03 DIAGNOSIS — Z302 Encounter for sterilization: Secondary | ICD-10-CM

## 2020-08-04 LAB — POST-VAS SPERM EVALUATION,QUAL: Volume: 3.2 mL

## 2020-08-07 ENCOUNTER — Telehealth: Payer: Self-pay

## 2020-08-07 NOTE — Telephone Encounter (Signed)
-----   Message from Billey Co, MD sent at 08/05/2020 10:03 AM EDT ----- No sperm seen, okay to discontinue alternative contraception  Nickolas Madrid, MD 08/05/2020

## 2020-08-07 NOTE — Telephone Encounter (Signed)
Called pt no answer. Left detailed message informing pt of the information below. Advised pt to call back for questions or concerns.

## 2020-08-11 ENCOUNTER — Other Ambulatory Visit: Payer: Self-pay | Admitting: Family Medicine

## 2020-08-16 ENCOUNTER — Other Ambulatory Visit: Payer: Self-pay

## 2020-08-16 ENCOUNTER — Telehealth (INDEPENDENT_AMBULATORY_CARE_PROVIDER_SITE_OTHER): Payer: BC Managed Care – PPO | Admitting: Family Medicine

## 2020-08-16 ENCOUNTER — Encounter: Payer: Self-pay | Admitting: Family Medicine

## 2020-08-16 DIAGNOSIS — F419 Anxiety disorder, unspecified: Secondary | ICD-10-CM | POA: Diagnosis not present

## 2020-08-16 DIAGNOSIS — G4733 Obstructive sleep apnea (adult) (pediatric): Secondary | ICD-10-CM

## 2020-08-16 DIAGNOSIS — I10 Essential (primary) hypertension: Secondary | ICD-10-CM | POA: Diagnosis not present

## 2020-08-16 DIAGNOSIS — N529 Male erectile dysfunction, unspecified: Secondary | ICD-10-CM

## 2020-08-16 DIAGNOSIS — G4730 Sleep apnea, unspecified: Secondary | ICD-10-CM | POA: Insufficient documentation

## 2020-08-16 MED ORDER — SILDENAFIL CITRATE 100 MG PO TABS: 100.0000 mg | ORAL_TABLET | Freq: Every day | ORAL | 1 refills | Status: DC | PRN

## 2020-08-16 NOTE — Progress Notes (Signed)
Virtual Visit via telephone Note  This visit type was conducted due to national recommendations for restrictions regarding the COVID-19 pandemic (e.g. social distancing).  This format is felt to be most appropriate for this patient at this time.  All issues noted in this document were discussed and addressed.  No physical exam was performed (except for noted visual exam findings with Video Visits).   I connected with Benjamin Summers today at  3:30 PM EDT by telephone and verified that I am speaking with the correct person using two identifiers. Location patient: home Location provider: work Persons participating in the virtual visit: patient, provider  I discussed the limitations, risks, security and privacy concerns of performing an evaluation and management service by telephone and the availability of in person appointments. I also discussed with the patient that there may be a patient responsible charge related to this service. The patient expressed understanding and agreed to proceed.  Interactive audio and video telecommunications were attempted between this provider and patient, however failed, due to patient having technical difficulties OR patient did not have access to video capability.  We continued and completed visit with audio only.   Reason for visit: f/u  HPI: HYPERTENSION  Disease Monitoring  Home BP Monitoring 140-145/89-90 Chest pain- no    Dyspnea- no Medications  Compliance-  Taking amlodipine, HCTZ, though has not been taking them consistently or daily  OSA: Patient reports a history of sleep apnea.  He was fit for a CPAP a number of years ago though was never successful at wearing it.  He is trying to start wearing it again and has not quite made it through an entire night due to inability to tolerate the mask.  He does note when he is able to wear it for at least part of the night he does feel better and notes his anxiety is better.  Erectile dysfunction: Patient notes  Viagra has been helpful.  He has had to take 100 mg of this.  He is able to get an erection and ejaculate when taking this medication.  No pain with erections.  Anxiety: Patient notes this is up and down.  He is no longer taking Prozac as he felt it may be affecting his erectile issues.  No depression.     ROS: See pertinent positives and negatives per HPI.  Past Medical History:  Diagnosis Date  . Depression   . Elevated cholesterol   . Hypertension   . Vaginal infection     No past surgical history on file.  Family History  Problem Relation Age of Onset  . Hypertension Mother   . Diabetes Mother     SOCIAL HX: Non-smoker   Current Outpatient Medications:  .  amLODipine (NORVASC) 10 MG tablet, TAKE 1 TABLET BY MOUTH EVERY DAY, Disp: 30 tablet, Rfl: 0 .  hydrochlorothiazide (HYDRODIURIL) 25 MG tablet, Take 1 tablet (25 mg total) by mouth daily., Disp: 90 tablet, Rfl: 1 .  rosuvastatin (CRESTOR) 40 MG tablet, TAKE 1 TABLET BY MOUTH EVERY DAY, Disp: 90 tablet, Rfl: 1 .  sildenafil (VIAGRA) 100 MG tablet, Take 1 tablet (100 mg total) by mouth daily as needed for erectile dysfunction., Disp: 10 tablet, Rfl: 1 .  VYVANSE 50 MG capsule, Take 50 mg by mouth daily., Disp: , Rfl:   EXAM: This is a telephone visit and thus no physical exam was completed.  ASSESSMENT AND PLAN:  Discussed the following assessment and plan:  Hypertension Above goal though has not  been taking his blood pressure medication consistently.  Encouraged him to take this consistently.  Discussed that untreated sleep apnea could be playing a role as well.  See below for plan for sleep apnea.  CMA to contact patient in 2 weeks to find out what his blood pressure is doing.  Message sent as a reminder to do this.  Sleep apnea New diagnosis to me.  Patient has difficulty tolerating his CPAP.  We will refer to pulmonology for management of this.  Discussed that getting this under better control may help with his  blood pressure.  Improved sleep could also help with anxiety.  Anxiety Relatively stable.  He is no longer on medication for this.  He would prefer to see how he does with treatment of his sleep apnea prior to starting on any additional medication for the anxiety.  Erectile dysfunction Patient has tolerated Viagra adequately and it has been beneficial.  Refill given.   Orders Placed This Encounter  Procedures  . Ambulatory referral to Pulmonology    Referral Priority:   Routine    Referral Type:   Consultation    Referral Reason:   Specialty Services Required    Requested Specialty:   Pulmonary Disease    Number of Visits Requested:   1    Meds ordered this encounter  Medications  . sildenafil (VIAGRA) 100 MG tablet    Sig: Take 1 tablet (100 mg total) by mouth daily as needed for erectile dysfunction.    Dispense:  10 tablet    Refill:  1     I discussed the assessment and treatment plan with the patient. The patient was provided an opportunity to ask questions and all were answered. The patient agreed with the plan and demonstrated an understanding of the instructions.   The patient was advised to call back or seek an in-person evaluation if the symptoms worsen or if the condition fails to improve as anticipated.  I provided 12 minutes of non-face-to-face time during this encounter.   [name], MD

## 2020-08-16 NOTE — Assessment & Plan Note (Signed)
Patient has tolerated Viagra adequately and it has been beneficial.  Refill given.

## 2020-08-16 NOTE — Assessment & Plan Note (Signed)
Relatively stable.  He is no longer on medication for this.  He would prefer to see how he does with treatment of his sleep apnea prior to starting on any additional medication for the anxiety.

## 2020-08-16 NOTE — Assessment & Plan Note (Addendum)
New diagnosis to me.  Patient has difficulty tolerating his CPAP.  We will refer to pulmonology for management of this.  Discussed that getting this under better control may help with his blood pressure.  Improved sleep could also help with anxiety.

## 2020-08-16 NOTE — Assessment & Plan Note (Addendum)
Above goal though has not been taking his blood pressure medication consistently.  Encouraged him to take this consistently.  Discussed that untreated sleep apnea could be playing a role as well.  See below for plan for sleep apnea.  CMA to contact patient in 2 weeks to find out what his blood pressure is doing.  Message sent as a reminder to do this.

## 2020-08-17 ENCOUNTER — Telehealth: Payer: Self-pay | Admitting: Family Medicine

## 2020-08-17 NOTE — Telephone Encounter (Signed)
Lvm to schedule 32m follow up

## 2020-08-30 ENCOUNTER — Telehealth: Payer: Self-pay

## 2020-08-30 DIAGNOSIS — I10 Essential (primary) hypertension: Secondary | ICD-10-CM

## 2020-08-30 NOTE — Telephone Encounter (Signed)
-----   Message from Leone Haven, MD sent at 08/16/2020  4:05 PM EDT ----- Regarding: call patient Please call the patient and see what his BP has been over the past 2 weeks. Thanks.

## 2020-08-30 NOTE — Telephone Encounter (Signed)
I called and LVM informing the patient to send his BP reading for the past 2 weeks to mychart for the provider to review, I also sent a message in mychart.  Siara Gorder,cma

## 2020-09-09 ENCOUNTER — Other Ambulatory Visit: Payer: Self-pay | Admitting: Family Medicine

## 2020-09-13 MED ORDER — LOSARTAN POTASSIUM 50 MG PO TABS: 50.0000 mg | ORAL_TABLET | Freq: Every day | ORAL | 3 refills | Status: DC

## 2020-09-28 ENCOUNTER — Ambulatory Visit: Payer: BC Managed Care – PPO

## 2020-09-28 ENCOUNTER — Other Ambulatory Visit: Payer: Self-pay

## 2020-09-28 ENCOUNTER — Ambulatory Visit (INDEPENDENT_AMBULATORY_CARE_PROVIDER_SITE_OTHER): Payer: BC Managed Care – PPO

## 2020-09-28 ENCOUNTER — Encounter: Payer: Self-pay | Admitting: *Deleted

## 2020-09-28 DIAGNOSIS — I10 Essential (primary) hypertension: Secondary | ICD-10-CM | POA: Diagnosis not present

## 2020-09-28 NOTE — Progress Notes (Signed)
Patient is here for a BP check due to bp being high at last visit, as per patient.  Currently patients BP is 114/76 and BPM is 76.  Patient has no complaints of headaches, blurry vision, chest pain, arm pain, light headedness, dizziness, and nor jaw pain. Please see previous note for order.

## 2020-09-29 LAB — BASIC METABOLIC PANEL
BUN: 13 mg/dL (ref 6–23)
CO2: 28 mEq/L (ref 19–32)
Calcium: 8.6 mg/dL (ref 8.4–10.5)
Chloride: 103 mEq/L (ref 96–112)
Creatinine, Ser: 1.3 mg/dL (ref 0.40–1.50)
GFR: 70 mL/min (ref >60.00)
Glucose, Bld: 90 mg/dL (ref 70–99)
Potassium: 3.7 mEq/L (ref 3.5–5.1)
Sodium: 139 mEq/L (ref 135–145)

## 2020-10-03 ENCOUNTER — Other Ambulatory Visit: Payer: Self-pay | Admitting: Family Medicine

## 2020-10-13 ENCOUNTER — Other Ambulatory Visit: Payer: Self-pay | Admitting: Family Medicine

## 2020-10-14 ENCOUNTER — Other Ambulatory Visit: Payer: Self-pay | Admitting: Family Medicine

## 2020-10-14 DIAGNOSIS — I10 Essential (primary) hypertension: Secondary | ICD-10-CM

## 2020-12-10 ENCOUNTER — Other Ambulatory Visit: Payer: Self-pay | Admitting: Family Medicine

## 2020-12-10 DIAGNOSIS — E785 Hyperlipidemia, unspecified: Secondary | ICD-10-CM

## 2020-12-15 ENCOUNTER — Telehealth: Payer: Self-pay | Admitting: Family Medicine

## 2020-12-15 NOTE — Telephone Encounter (Signed)
Please contact the patient and see if there is a particular reason that he declined the referral to pulmonology for his sleep apnea.  Thanks.

## 2020-12-15 NOTE — Telephone Encounter (Signed)
Rejection Reason - Patient Declined" [facility] said on Dec 15, 2020 8:14 AM

## 2020-12-15 NOTE — Telephone Encounter (Signed)
LVM for the patient to call back. Janice Seales,cma  

## 2020-12-18 NOTE — Telephone Encounter (Signed)
There is the potential that he may have to have another sleep study though they may be able to track down his prior study and treat him based off of that.  I do think it would be worthwhile having him see the pulmonologist.

## 2020-12-18 NOTE — Telephone Encounter (Signed)
LVM for the patient to return a call to me.  Hong Moring,cma  

## 2020-12-18 NOTE — Telephone Encounter (Signed)
Pt didn't want to be referred because he didn't want to have to take the chance on having another sleep study done

## 2020-12-25 NOTE — Telephone Encounter (Signed)
I called and LVM for the patien tto call back when the office is open tomorrow for a message from the provider.  Carrah Eppolito,cma

## 2021-01-03 NOTE — Telephone Encounter (Signed)
Pt called returning your call 

## 2021-01-16 NOTE — Telephone Encounter (Signed)
Could not reach patient by telephone, I LVM informing the patient that I would send him a message on mychart.  Artemus Romanoff,cma

## 2021-08-28 ENCOUNTER — Encounter: Payer: Self-pay | Admitting: Family Medicine

## 2021-08-28 NOTE — Telephone Encounter (Signed)
This patient needs an appointment with me or any other provider that has availability. Please contact him to get an appointment scheduled to evaluate this. Thanks.

## 2021-08-29 NOTE — Telephone Encounter (Signed)
LVM for patient to call back and be scheduled for a visit with provider or someone in office.  Cap Massi,cma

## 2021-08-30 NOTE — Telephone Encounter (Signed)
LVM for patient to call back to be seen by someone in the office.  Ziere Docken,cma

## 2021-09-10 ENCOUNTER — Other Ambulatory Visit: Payer: Self-pay

## 2021-09-10 ENCOUNTER — Encounter: Payer: Self-pay | Admitting: Family Medicine

## 2021-09-10 ENCOUNTER — Ambulatory Visit: Payer: BC Managed Care – PPO | Admitting: Family Medicine

## 2021-09-10 VITALS — BP 130/85 | HR 92 | Temp 98.6°F | Ht 70.0 in | Wt 282.2 lb

## 2021-09-10 DIAGNOSIS — R7303 Prediabetes: Secondary | ICD-10-CM | POA: Diagnosis not present

## 2021-09-10 DIAGNOSIS — K6289 Other specified diseases of anus and rectum: Secondary | ICD-10-CM | POA: Insufficient documentation

## 2021-09-10 DIAGNOSIS — E785 Hyperlipidemia, unspecified: Secondary | ICD-10-CM | POA: Diagnosis not present

## 2021-09-10 DIAGNOSIS — I10 Essential (primary) hypertension: Secondary | ICD-10-CM

## 2021-09-10 DIAGNOSIS — Z125 Encounter for screening for malignant neoplasm of prostate: Secondary | ICD-10-CM | POA: Diagnosis not present

## 2021-09-10 NOTE — Assessment & Plan Note (Signed)
Potentially has a fissure based on his discomfort during exam though the rest of his history does not seem like a fissure.  There were no palpable abnormalities on exam.  He will trial MiraLAX to see if that helps soften stools to see if that is beneficial.  We will refer him to GI to consider further evaluation and exam with possible colonoscopy or other direct visualization indicated.

## 2021-09-10 NOTE — Assessment & Plan Note (Signed)
Slightly elevated today that we were discussing a sensitive topic and doing a sensitive exam.  He will start monitoring at home several times a week.  I will see him back in 3 months.  He will continue his losartan, HCTZ, and amlodipine.

## 2021-09-10 NOTE — Assessment & Plan Note (Signed)
Check lipid panel.  Check CMP.  Continue Crestor.

## 2021-09-10 NOTE — Progress Notes (Signed)
[name], MD Phone: [phone]  Benjamin Summers is a 45 y.o. male who presents today for same-day visit.  Rectal pain: This has been going on about a month or so.  Started sometime after he had a bowel movement.  Did not have pain specifically with a bowel movement.  Notes it just did not go away.  He notes he has the discomfort that most times.  Sometimes it occurs with bowel movement and other times it is afterwards.  No hemorrhoids.  No bleeding.  No changes to his stools.  No straining.  No melena.  No family history of rectal or colon cancer.  He notes it does not feel like he is passing shards of glass.  Hypertension: Not checking at home.  He is taking amlodipine 10 mg once daily, HCTZ 25 mg daily, and losartan 50 mg daily.  Social History   Tobacco Use  Smoking Status Never  Smokeless Tobacco Never    Current Outpatient Medications on File Prior to Visit  Medication Sig Dispense Refill   amLODipine (NORVASC) 10 MG tablet TAKE 1 TABLET BY MOUTH EVERY DAY 90 tablet 1   hydrochlorothiazide (HYDRODIURIL) 25 MG tablet TAKE 1 TABLET BY MOUTH EVERY DAY 90 tablet 1   losartan (COZAAR) 50 MG tablet Take 1 tablet (50 mg total) by mouth daily. 90 tablet 3   rosuvastatin (CRESTOR) 40 MG tablet TAKE 1 TABLET BY MOUTH EVERY DAY 90 tablet 1   sildenafil (VIAGRA) 100 MG tablet Take 1 tablet (100 mg total) by mouth daily as needed for erectile dysfunction. 10 tablet 1   VYVANSE 50 MG capsule Take 50 mg by mouth daily.     No current facility-administered medications on file prior to visit.     ROS see history of present illness  Objective  Physical Exam Vitals:   09/10/21 1611  BP: 130/85  Pulse: 92  Temp: 98.6 F (37 C)  SpO2: 97%    BP Readings from Last 3 Encounters:  09/10/21 130/85  09/28/20 114/76  05/24/20 120/81   Wt Readings from Last 3 Encounters:  09/10/21 282 lb 3.2 oz (128 kg)  08/16/20 229 lb (103.9 kg)  04/27/20 229 lb (103.9 kg)    Physical  Exam Constitutional:      General: He is not in acute distress.    Appearance: He is not diaphoretic.  Cardiovascular:     Rate and Rhythm: Normal rate and regular rhythm.     Heart sounds: Normal heart sounds.  Pulmonary:     Effort: Pulmonary effort is normal.     Breath sounds: Normal breath sounds.  Genitourinary:    Comments: External rectum appears normal, no palpable lesions on internal rectal exam, he did have discomfort as I had to rotate my finger on the rectal exam though once my finger was in the same position he had no further discomfort, rectum was tight and difficult to advance finger to the prostate given the length of his rectum Skin:    General: Skin is warm and dry.  Neurological:     Mental Status: He is alert.     Assessment/Plan: Please see individual problem list.  Problem List Items Addressed This Visit     Hyperlipidemia   Relevant Orders   Lipid panel   Comp Met (CMET)   Hypertension - Primary    Slightly elevated today that we were discussing a sensitive topic and doing a sensitive exam.  He will start monitoring at home several times a  week.  I will see him back in 3 months.  He will continue his losartan, HCTZ, and amlodipine.      Relevant Orders   Comp Met (CMET)   Prediabetes   Relevant Orders   HgB A1c   Rectal pain    Potentially has a fissure based on his discomfort during exam though the rest of his history does not seem like a fissure.  There were no palpable abnormalities on exam.  He will trial MiraLAX to see if that helps soften stools to see if that is beneficial.  We will refer him to GI to consider further evaluation and exam with possible colonoscopy or other direct visualization indicated.      Relevant Orders   Ambulatory referral to Gastroenterology   Other Visit Diagnoses     Prostate cancer screening       Relevant Orders   PSA      Return in about 3 months (around 12/11/2021) for Hypertension.  This visit occurred  during the SARS-CoV-2 public health emergency.  Safety protocols were in place, including screening questions prior to the visit, additional usage of staff PPE, and extensive cleaning of exam room while observing appropriate contact time as indicated for disinfecting solutions.    [name], MD [facility].

## 2021-09-10 NOTE — Patient Instructions (Signed)
Nice to see you. GI should contact you to schedule an appointment. You should try taking some MiraLAX to see if that will soften your stools up to make it easier to pass them. Ibuprofen will be okay to take for the discomfort intermittently though you need to take this with food and do not take it more than 600 mg every 12 hours.

## 2021-09-11 ENCOUNTER — Encounter: Payer: Self-pay | Admitting: *Deleted

## 2021-09-11 LAB — COMPREHENSIVE METABOLIC PANEL
ALT: 13 U/L (ref 0–53)
AST: 13 U/L (ref 0–37)
Albumin: 4.7 g/dL (ref 3.5–5.2)
Alkaline Phosphatase: 50 U/L (ref 39–117)
BUN: 13 mg/dL (ref 6–23)
CO2: 28 mEq/L (ref 19–32)
Calcium: 9.4 mg/dL (ref 8.4–10.5)
Chloride: 99 mEq/L (ref 96–112)
Creatinine, Ser: 1.23 mg/dL (ref 0.40–1.50)
GFR: 71.62 mL/min (ref >60.00)
Glucose, Bld: 94 mg/dL (ref 70–99)
Potassium: 3.7 mEq/L (ref 3.5–5.1)
Sodium: 138 mEq/L (ref 135–145)
Total Bilirubin: 0.4 mg/dL (ref 0.2–1.2)
Total Protein: 7.4 g/dL (ref 6.0–8.3)

## 2021-09-11 LAB — LIPID PANEL
Cholesterol: 224 mg/dL — ABNORMAL HIGH (ref 0–200)
HDL: 48.8 mg/dL (ref >39.00)
NonHDL: 175.5
Total CHOL/HDL Ratio: 5
Triglycerides: 267 mg/dL — ABNORMAL HIGH (ref 0.0–149.0)
VLDL: 53.4 mg/dL — ABNORMAL HIGH (ref 0.0–40.0)

## 2021-09-11 LAB — HEMOGLOBIN A1C: Hgb A1c MFr Bld: 6.2 % (ref 4.6–6.5)

## 2021-09-11 LAB — LDL CHOLESTEROL, DIRECT: Direct LDL: 143 mg/dL

## 2021-09-11 LAB — PSA: PSA: 0.35 ng/mL (ref 0.10–4.00)

## 2021-09-13 ENCOUNTER — Other Ambulatory Visit: Payer: Self-pay

## 2021-09-13 ENCOUNTER — Ambulatory Visit: Payer: BC Managed Care – PPO | Admitting: Gastroenterology

## 2021-09-13 ENCOUNTER — Encounter: Payer: Self-pay | Admitting: Gastroenterology

## 2021-09-13 VITALS — BP 133/86 | HR 96 | Temp 96.0°F | Ht 70.0 in | Wt 281.0 lb

## 2021-09-13 DIAGNOSIS — K602 Anal fissure, unspecified: Secondary | ICD-10-CM

## 2021-09-13 DIAGNOSIS — K6289 Other specified diseases of anus and rectum: Secondary | ICD-10-CM

## 2021-09-13 MED ORDER — PEG 3350-KCL-NA BICARB-NACL 420 G PO SOLR: ORAL | 0 refills | Status: DC

## 2021-09-13 NOTE — Patient Instructions (Signed)
[facility] [address], [place], [place] [address] [phone]   Anal Fissure, Adult An anal fissure is a small tear or crack in the tissue around the opening of the butt (anus). Bleeding from the tear or crack usually stops on its own within a few minutes. The bleeding may happen every time you poop (have a bowel movement) until the tear or crack heals. What are the causes? This condition is usually caused by passing a large or hard poop (stool). Other causes include: Trouble pooping (constipation). Passing watery poop (diarrhea). Inflammatory bowel disease (Crohn's disease or ulcerative colitis). Childbirth. Infections. Anal sex. What are the signs or symptoms? Symptoms of this condition include: Bleeding from the butt. Small amounts of blood on your poop. The blood coats the outside of the poop. It is not mixed with the poop. Small amounts of blood on the toilet paper or in the toilet after you poop. Pain when passing poop. Itching or irritation around the opening of the butt. How is this diagnosed? This condition may be diagnosed based on a physical exam. Your doctor may: Check your butt. A tear can often be seen by checking the area with care. Check your butt using a short tube (anoscope). The light in the tube will show any problems in your butt. How is this treated? Treatment for this condition may include: Treating problems that make it hard for you to pass poop. You may be told to: Eat more fiber. Drink more fluid. Take fiber supplements. Take medicines that make poop soft. Taking sitz baths. This may help to heal the tear. Using creams and ointments. If your condition gets worse, other treatments may be needed such as: A shot near the tear or crack (botulinum injection). Surgery to repair the tear or crack. Follow these instructions at home: Eating and drinking  Avoid bananas and dairy products. These foods can make it hard to poop. Drink enough fluid to keep your  pee (urine) pale yellow. Eat foods that have a lot of fiber in them, such as: Beans. Whole grains. Fresh fruits. Fresh vegetables. General instructions  Take over-the-counter and prescription medicines only as told by your doctor. Use creams or ointments only as told by your doctor. Keep the butt area as clean and dry as you can. Take a warm water bath (sitz bath) as told by your doctor. Do not use soap. Keep all follow-up visits as told by your doctor. This is important. Contact a doctor if: You have more bleeding. You have a fever. You have watery poop that is mixed with blood. You have pain. Your problem gets worse, not better. Summary An anal fissure is a small tear or crack in the skin around the opening of the butt (anus). This condition is usually caused by passing a large or hard poop (stool). Treatment includes treating the problems that make it hard for you to pass poop. Follow your doctor's instructions about caring for your condition at home. Keep all follow-up visits as told by your doctor. This is important. This information is not intended to replace advice given to you by your health care provider. Make sure you discuss any questions you have with your health care provider. Document Revised: 05/07/2018 Document Reviewed: 05/07/2018 Elsevier Patient Education  [place].

## 2021-09-13 NOTE — Progress Notes (Signed)
Jonathon Bellows MD, MRCP(U.K) [address]  [place]  [place], [place] [address]  Main: [phone]  Fax: [phone]   Gastroenterology Consultation  Referring Provider:     Leone Haven, MD Primary Care Physician:  Leone Haven, MD Primary Gastroenterologist:  Dr. Jonathon Bellows  Reason for Consultation:     Rectal pain        HPI:   Benjamin Summers is a 45 y.o. y/o male referred for consultation & management  by Dr. Caryl Bis, Angela Adam, MD.    He says for the past month has been having pain after passage of stools in the anal area sharp in nature.  Denies any clear constipation.  Denies any change in bowel habits.  Denies any blood in stool.  No prior colonoscopy.  No family for colon cancer or polyps.   Past Medical History:  Diagnosis Date   Depression    Elevated cholesterol    Hypertension    Vaginal infection     No past surgical history on file.  Prior to Admission medications   Medication Sig Start Date End Date Taking? Authorizing Provider  amLODipine (NORVASC) 10 MG tablet TAKE 1 TABLET BY MOUTH EVERY DAY 10/13/20   Leone Haven, MD  hydrochlorothiazide (HYDRODIURIL) 25 MG tablet TAKE 1 TABLET BY MOUTH EVERY DAY 10/16/20   Leone Haven, MD  losartan (COZAAR) 50 MG tablet Take 1 tablet (50 mg total) by mouth daily. 09/13/20   Leone Haven, MD  rosuvastatin (CRESTOR) 40 MG tablet TAKE 1 TABLET BY MOUTH EVERY DAY 12/11/20   Leone Haven, MD  sildenafil (VIAGRA) 100 MG tablet Take 1 tablet (100 mg total) by mouth daily as needed for erectile dysfunction. 08/16/20   Leone Haven, MD  VYVANSE 50 MG capsule Take 50 mg by mouth daily. 12/27/19   [provider]    Family History  Problem Relation Age of Onset   Hypertension Mother    Diabetes Mother      Social History   Tobacco Use   Smoking status: Never   Smokeless tobacco: Never  Substance Use Topics   Alcohol use: Yes    Alcohol/week: 7.0 standard drinks     Types: 7 Standard drinks or equivalent per week   Drug use: No    Allergies as of 09/13/2021   (No Known Allergies)    Review of Systems:    All systems reviewed and negative except where noted in HPI.   Physical Exam:  BP 133/86   Pulse 96   Temp (!) 96 F (35.6 C) (Oral)   Ht 5\' 10"  (1.778 m)   Wt 281 lb (127.5 kg)   BMI 40.32 kg/m  No LMP for male patient. Psych:  Alert and cooperative. Normal mood and affect. General:   Alert,  Well-developed, well-nourished, pleasant and cooperative in NAD Head:  Normocephalic and atraumatic. Eyes:  Sclera clear, no icterus.   Conjunctiva pink. Ears:  Normal auditory acuity. Nose:  No deformity, discharge, or lesions. Mouth:  No deformity or lesions,oropharynx pink & moist. Neck:  Supple; no masses or thyromegaly. Lungs:  Respirations even and unlabored.  Clear throughout to auscultation.   No wheezes, crackles, or rhonchi. No acute distress. Rectal exam: Chaperone in the room: Mild tenderness in the posterior aspect of the anal canal.  No other abnormalities Heart:  Regular rate and rhythm; no murmurs, clicks, rubs, or gallops. Neurologic:  Alert and oriented x3;  grossly normal neurologically.  Psych:  Alert and cooperative. Normal mood and affect.  Imaging Studies: No results found.  Assessment and Plan:   Benjamin Summers is a 45 y.o. y/o male has been referred for rectal pain.  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  High-fiber diet patient information provided and counseled.  Counseled about perianal hygiene and toileting. 2.  Colonoscopy to rule out Crohn's disease/malignancy 3.  Compounded medication ordered for anal fissure, if does not work would need botox injection   I have discussed alternative options, risks & benefits,  which include, but are not limited to, bleeding, infection, perforation,respiratory complication & drug reaction.  The patient agrees with this plan & written consent will be obtained.     Follow up in  as needed   Dr Jonathon Bellows MD,MRCP(U.K)

## 2021-09-17 ENCOUNTER — Other Ambulatory Visit: Payer: Self-pay | Admitting: Family Medicine

## 2021-09-18 ENCOUNTER — Other Ambulatory Visit: Payer: Self-pay | Admitting: Family Medicine

## 2021-09-18 DIAGNOSIS — E785 Hyperlipidemia, unspecified: Secondary | ICD-10-CM

## 2021-09-19 ENCOUNTER — Encounter: Payer: Self-pay | Admitting: Gastroenterology

## 2021-09-20 ENCOUNTER — Ambulatory Visit: Payer: BC Managed Care – PPO | Admitting: Anesthesiology

## 2021-09-20 ENCOUNTER — Ambulatory Visit
Admission: RE | Admit: 2021-09-20 | Discharge: 2021-09-20 | Disposition: A | Discharge status: Home / Self Care | Payer: BC Managed Care – PPO | Attending: Gastroenterology | Admitting: Gastroenterology

## 2021-09-20 ENCOUNTER — Encounter: Payer: Self-pay | Admitting: Gastroenterology

## 2021-09-20 ENCOUNTER — Encounter
Admission: RE | Disposition: A | Discharge status: Home / Self Care | Payer: Self-pay | Source: Home / Self Care | Attending: Gastroenterology

## 2021-09-20 ENCOUNTER — Telehealth: Payer: Self-pay | Admitting: Family Medicine

## 2021-09-20 DIAGNOSIS — K635 Polyp of colon: Secondary | ICD-10-CM

## 2021-09-20 DIAGNOSIS — K6289 Other specified diseases of anus and rectum: Secondary | ICD-10-CM

## 2021-09-20 DIAGNOSIS — Z79899 Other long term (current) drug therapy: Secondary | ICD-10-CM | POA: Diagnosis not present

## 2021-09-20 DIAGNOSIS — K640 First degree hemorrhoids: Secondary | ICD-10-CM

## 2021-09-20 DIAGNOSIS — K602 Anal fissure, unspecified: Secondary | ICD-10-CM

## 2021-09-20 DIAGNOSIS — D126 Benign neoplasm of colon, unspecified: Secondary | ICD-10-CM | POA: Diagnosis not present

## 2021-09-20 HISTORY — PX: COLONOSCOPY WITH PROPOFOL: SHX5780

## 2021-09-20 SURGERY — COLONOSCOPY WITH PROPOFOL
Anesthesia: General

## 2021-09-20 MED ORDER — PROPOFOL 500 MG/50ML IV EMUL
INTRAVENOUS | Status: AC
  Filled 2021-09-20: qty 50

## 2021-09-20 MED ORDER — DEXMEDETOMIDINE HCL IN NACL 200 MCG/50ML IV SOLN
INTRAVENOUS | Status: DC | PRN
  Administered 2021-09-20: 12 ug via INTRAVENOUS
  Administered 2021-09-20: 8 ug via INTRAVENOUS

## 2021-09-20 MED ORDER — PROPOFOL 10 MG/ML IV BOLUS
INTRAVENOUS | Status: DC | PRN
  Administered 2021-09-20: 100 mg via INTRAVENOUS

## 2021-09-20 MED ORDER — PROPOFOL 500 MG/50ML IV EMUL
INTRAVENOUS | Status: DC | PRN
  Administered 2021-09-20: 150 ug/kg/min via INTRAVENOUS

## 2021-09-20 MED ORDER — LIDOCAINE HCL (CARDIAC) PF 100 MG/5ML IV SOSY
PREFILLED_SYRINGE | INTRAVENOUS | Status: DC | PRN
  Administered 2021-09-20: 50 mg via INTRAVENOUS

## 2021-09-20 MED ORDER — SODIUM CHLORIDE 0.9 % IV SOLN
INTRAVENOUS | Status: DC
  Administered 2021-09-20: 1000 mL via INTRAVENOUS

## 2021-09-20 NOTE — Telephone Encounter (Signed)
Patient states he is returning a call to [facility]. Please advise

## 2021-09-20 NOTE — Anesthesia Preprocedure Evaluation (Signed)
Anesthesia Evaluation  Patient identified by MRN, date of birth, ID band Patient awake    Reviewed: Allergy & Precautions, NPO status , Patient's Chart, lab work & pertinent test results  Airway Mallampati: III  TM Distance: >3 FB Neck ROM: Full    Dental no notable dental hx.    Pulmonary sleep apnea (Noncompliant) ,    Pulmonary exam normal        Cardiovascular hypertension, Pt. on medications negative cardio ROS Normal cardiovascular exam     Neuro/Psych  Headaches, PSYCHIATRIC DISORDERS Anxiety Depression    GI/Hepatic negative GI ROS, Neg liver ROS,   Endo/Other  Morbid obesity  Renal/GU negative Renal ROS  negative genitourinary   Musculoskeletal negative musculoskeletal ROS (+)   Abdominal   Peds negative pediatric ROS (+)  Hematology negative hematology ROS (+)   Anesthesia Other Findings Depression    Elevated cholesterol    Hypertension       Reproductive/Obstetrics negative OB ROS                             Anesthesia Physical Anesthesia Plan  ASA: 3  Anesthesia Plan: General   Post-op Pain Management:    Induction: Intravenous  PONV Risk Score and Plan: 2 and Propofol infusion and TIVA  Airway Management Planned: Natural Airway and Nasal Cannula  Additional Equipment:   Intra-op Plan:   Post-operative Plan:   Informed Consent: I have reviewed the patients History and Physical, chart, labs and discussed the procedure including the risks, benefits and alternatives for the proposed anesthesia with the patient or authorized representative who has indicated his/her understanding and acceptance.       Plan Discussed with: CRNA, Anesthesiologist and Surgeon  Anesthesia Plan Comments:         Anesthesia Quick Evaluation

## 2021-09-20 NOTE — Transfer of Care (Signed)
Immediate Anesthesia Transfer of Care Note  Patient: Benjamin Summers  Procedure(s) Performed: COLONOSCOPY WITH PROPOFOL  Patient Location: PACU and Endoscopy Unit  Anesthesia Type:General  Level of Consciousness: drowsy and patient cooperative  Airway & Oxygen Therapy: Patient Spontanous Breathing  Post-op Assessment: Report given to RN and Post -op Vital signs reviewed and stable  Post vital signs: Reviewed and stable  Last Vitals:  Vitals Value Taken Time  BP 124/77 09/20/21 1108  Temp    Pulse 95 09/20/21 1108  Resp 19 09/20/21 1108  SpO2 95 % 09/20/21 1108  Vitals shown include unvalidated device data.  Last Pain:  Vitals:   09/20/21 1107  TempSrc:   PainSc: 0-No pain         Complications: No notable events documented.

## 2021-09-20 NOTE — Op Note (Signed)
[facility] Gastroenterology Patient Name: Benjamin Summers Procedure Date: 09/20/2021 10:33 AM MRN: [id number] Account #: [id number] Date of Birth: 08/10/1976 Admit Type: Outpatient Age: 45 Room: [facility] ENDO ROOM 2 Gender: Male Note Status: Finalized Instrument Name: Jasper Riling [id number] Procedure:             Colonoscopy Indications:           Rectal pain Providers:             Jonathon Bellows MD, MD Referring MD:          Angela Adam. Caryl Bis (Referring MD) Medicines:             Monitored Anesthesia Care Complications:         No immediate complications. Procedure:             Pre-Anesthesia Assessment:                        - Prior to the procedure, a History and Physical was                         performed, and patient medications, allergies and                         sensitivities were reviewed. The patient's tolerance                         of previous anesthesia was reviewed.                        - The risks and benefits of the procedure and the                         sedation options and risks were discussed with the                         patient. All questions were answered and informed                         consent was obtained.                        - ASA Grade Assessment: II - A patient with mild                         systemic disease.                        After obtaining informed consent, the colonoscope was                         passed under direct vision. Throughout the procedure,                         the patient's blood pressure, pulse, and oxygen                         saturations were monitored continuously. The                         Colonoscope was introduced through the anus  and                         advanced to the the cecum, identified by the                         appendiceal orifice. The colonoscopy was performed                         with ease. The patient tolerated the procedure well.                         The quality  of the bowel preparation was good. Findings:      An anal fissure was found on perianal exam.      A 5 mm polyp was found in the sigmoid colon. The polyp was sessile. The       polyp was removed with a cold snare. Resection and retrieval were       complete.      Two sessile polyps were found in the ascending colon. The polyps were 4       to 5 mm in size. These polyps were removed with a cold snare. Resection       and retrieval were complete.      Non-bleeding internal hemorrhoids were found during retroflexion. The       hemorrhoids were medium-sized and Grade I (internal hemorrhoids that do       not prolapse).      The exam was otherwise without abnormality on direct and retroflexion       views. Impression:            - Anal fissure found on perianal exam.                        - One 5 mm polyp in the sigmoid colon, removed with a                         cold snare. Resected and retrieved.                        - Two 4 to 5 mm polyps in the ascending colon, removed                         with a cold snare. Resected and retrieved.                        - Non-bleeding internal hemorrhoids.                        - The examination was otherwise normal on direct and                         retroflexion views. Recommendation:        - Discharge patient to home (with escort).                        - Resume previous diet.                        - Continue present medications.                        -  Await pathology results.                        - Repeat colonoscopy for surveillance based on                         pathology results. Procedure Code(s):     --- Professional ---                        [phone], Colonoscopy, flexible; with removal of                         tumor(s), polyp(s), or other lesion(s) by snare                         technique Diagnosis Code(s):     --- Professional ---                        K60.2, Anal fissure, unspecified                        K64.0,  First degree hemorrhoids                        K63.5, Polyp of colon                        K62.89, Other specified diseases of anus and rectum CPT copyright 2019 American Medical Association. All rights reserved. The codes documented in this report are preliminary and upon coder review may  be revised to meet current compliance requirements. Jonathon Bellows, MD Jonathon Bellows MD, MD 09/20/2021 11:05:05 AM This report has been signed electronically. Number of Addenda: 0 Note Initiated On: 09/20/2021 10:33 AM Scope Withdrawal Time: 0 hours 13 minutes 13 seconds  Total Procedure Duration: 0 hours 16 minutes 25 seconds  Estimated Blood Loss:  Estimated blood loss: none.      [facility]

## 2021-09-20 NOTE — Anesthesia Postprocedure Evaluation (Signed)
Anesthesia Post Note  Patient: Benjamin Summers  Procedure(s) Performed: COLONOSCOPY WITH PROPOFOL  Patient location during evaluation: Phase II Anesthesia Type: General Level of consciousness: awake and alert, awake and oriented Pain management: pain level controlled Vital Signs Assessment: post-procedure vital signs reviewed and stable Respiratory status: spontaneous breathing, nonlabored ventilation and respiratory function stable Cardiovascular status: blood pressure returned to baseline and stable Postop Assessment: no apparent nausea or vomiting Anesthetic complications: no   No notable events documented.   Last Vitals:  Vitals:   09/20/21 1108 09/20/21 1130  BP: 124/77 (!) 138/100  Pulse:    Resp: 16   Temp:    SpO2: 98%     Last Pain:  Vitals:   09/20/21 1130  TempSrc:   PainSc: 0-No pain                 Phill Mutter

## 2021-09-20 NOTE — H&P (Signed)
Jonathon Bellows, MD [address] [place], [place], [place], [address] [address], [place], [place], [place], [address] Phone: [phone]  Fax: [phone]  Primary Care Physician:  Leone Haven, MD   Pre-Procedure History & Physical: HPI:  Benjamin Summers is a 45 y.o. male is here for an colonoscopy.   Past Medical History:  Diagnosis Date   Depression    Elevated cholesterol    Hypertension    Vaginal infection     History reviewed. No pertinent surgical history.  Prior to Admission medications   Medication Sig Start Date End Date Taking? Authorizing Provider  amLODipine (NORVASC) 10 MG tablet TAKE 1 TABLET BY MOUTH EVERY DAY 09/17/21  Yes Leone Haven, MD  hydrochlorothiazide (HYDRODIURIL) 25 MG tablet TAKE 1 TABLET BY MOUTH EVERY DAY 10/16/20  Yes Leone Haven, MD  losartan (COZAAR) 50 MG tablet Take 1 tablet (50 mg total) by mouth daily. 09/13/20  Yes Leone Haven, MD  polyethylene glycol-electrolytes (NULYTELY) 420 g solution Prepare according to package instructions. Starting at 5:00 PM: Drink one 8 oz glass of mixture every 15 minutes until you finish half of the jug. Five hours prior to procedure, drink 8 oz glass of mixture every 15 minutes until it is all gone. Make sure you do not drink anything 4 hours prior to your procedure. 09/13/21  Yes Jonathon Bellows, MD  rosuvastatin (CRESTOR) 40 MG tablet TAKE 1 TABLET BY MOUTH EVERY DAY 12/11/20  Yes Leone Haven, MD  sildenafil (VIAGRA) 100 MG tablet Take 1 tablet (100 mg total) by mouth daily as needed for erectile dysfunction. Patient not taking: Reported on 09/13/2021 08/16/20   Leone Haven, MD  VYVANSE 50 MG capsule Take 50 mg by mouth daily. Patient not taking: Reported on 09/13/2021 12/27/19   [provider]    Allergies as of 09/14/2021   (No Known Allergies)    Family History  Problem Relation Age of Onset   Hypertension Mother    Diabetes Mother     Social History    Socioeconomic History   Marital status: Married    Spouse name: Not on file   Number of children: Not on file   Years of education: Not on file   Highest education level: Not on file  Occupational History   Not on file  Tobacco Use   Smoking status: Never   Smokeless tobacco: Never  Vaping Use   Vaping Use: Never used  Substance and Sexual Activity   Alcohol use: Yes    Alcohol/week: 7.0 standard drinks    Types: 7 Standard drinks or equivalent per week   Drug use: No   Sexual activity: Yes    Birth control/protection: Surgical    Comment: Vasectomy 04/27/2020  Other Topics Concern   Not on file  Social History Narrative   Not on file   Social Determinants of Health   Financial Resource Strain: Not on file  Food Insecurity: Not on file  Transportation Needs: Not on file  Physical Activity: Not on file  Stress: Not on file  Social Connections: Not on file  Intimate Partner Violence: Not on file    Review of Systems: See HPI, otherwise negative ROS  Physical Exam: BP (!) 139/102   Pulse 76   Temp (!) 96.4 F (35.8 C) (Temporal)   Resp 18   Ht 5\' 10"  (1.778 m)   Wt 126.8 kg   SpO2 100%   BMI 40.11 kg/m  General:   Alert,  pleasant and cooperative in NAD Head:  Normocephalic and atraumatic. Neck:  Supple; no masses or thyromegaly. Lungs:  Clear throughout to auscultation, normal respiratory effort.    Heart:  +S1, +S2, Regular rate and rhythm, No edema. Abdomen:  Soft, nontender and nondistended. Normal bowel sounds, without guarding, and without rebound.   Neurologic:  Alert and  oriented x4;  grossly normal neurologically.  Impression/Plan: Benjamin Summers is here for an colonoscopy to be performed for rectal pain. Risks, benefits, limitations, and alternatives regarding  colonoscopy have been reviewed with the patient.  Questions have been answered.  All parties agreeable.   Jonathon Bellows, MD  09/20/2021, 10:34 AM

## 2021-09-21 ENCOUNTER — Encounter: Payer: Self-pay | Admitting: Gastroenterology

## 2021-09-21 LAB — SURGICAL PATHOLOGY

## 2021-09-23 ENCOUNTER — Other Ambulatory Visit: Payer: Self-pay | Admitting: Family Medicine

## 2021-09-23 DIAGNOSIS — I10 Essential (primary) hypertension: Secondary | ICD-10-CM

## 2021-09-24 ENCOUNTER — Encounter: Payer: Self-pay | Admitting: Gastroenterology

## 2021-09-25 NOTE — Telephone Encounter (Signed)
LMTCB and schedule lab appt in 6 weeks

## 2021-09-27 NOTE — Telephone Encounter (Signed)
Contacted through mychart

## 2021-10-29 ENCOUNTER — Other Ambulatory Visit: Payer: Self-pay

## 2021-10-29 ENCOUNTER — Other Ambulatory Visit (INDEPENDENT_AMBULATORY_CARE_PROVIDER_SITE_OTHER): Payer: BC Managed Care – PPO

## 2021-10-29 DIAGNOSIS — E785 Hyperlipidemia, unspecified: Secondary | ICD-10-CM | POA: Diagnosis not present

## 2021-10-29 LAB — HEPATIC FUNCTION PANEL
ALT: 17 U/L (ref 0–53)
AST: 19 U/L (ref 0–37)
Albumin: 4.8 g/dL (ref 3.5–5.2)
Alkaline Phosphatase: 43 U/L (ref 39–117)
Bilirubin, Direct: 0.1 mg/dL (ref 0.0–0.3)
Total Bilirubin: 0.4 mg/dL (ref 0.2–1.2)
Total Protein: 7.9 g/dL (ref 6.0–8.3)

## 2021-10-29 LAB — LDL CHOLESTEROL, DIRECT: Direct LDL: 110 mg/dL

## 2021-12-23 ENCOUNTER — Other Ambulatory Visit: Payer: Self-pay | Admitting: Family Medicine

## 2021-12-23 ENCOUNTER — Encounter: Payer: Self-pay | Admitting: Family Medicine

## 2021-12-23 DIAGNOSIS — E785 Hyperlipidemia, unspecified: Secondary | ICD-10-CM

## 2021-12-23 DIAGNOSIS — I10 Essential (primary) hypertension: Secondary | ICD-10-CM

## 2022-01-09 ENCOUNTER — Encounter: Payer: Self-pay | Admitting: Family Medicine

## 2022-01-09 ENCOUNTER — Ambulatory Visit: Payer: BC Managed Care – PPO | Admitting: Family Medicine

## 2022-01-09 ENCOUNTER — Other Ambulatory Visit: Payer: Self-pay

## 2022-01-09 DIAGNOSIS — K1379 Other lesions of oral mucosa: Secondary | ICD-10-CM | POA: Diagnosis not present

## 2022-01-09 DIAGNOSIS — I10 Essential (primary) hypertension: Secondary | ICD-10-CM

## 2022-01-09 DIAGNOSIS — E669 Obesity, unspecified: Secondary | ICD-10-CM

## 2022-01-09 DIAGNOSIS — G4733 Obstructive sleep apnea (adult) (pediatric): Secondary | ICD-10-CM | POA: Diagnosis not present

## 2022-01-09 NOTE — Progress Notes (Signed)
[name], MD Phone: [phone]  Benjamin Summers is a 46 y.o. male who presents today for f/u.  HYPERTENSION Disease Monitoring Home BP Monitoring reports they have been good Chest pain- no    Dyspnea- no Medications Compliance-  taking amlodipine, HCTZ, losartan.   Edema- no BMET    Component Value Date/Time   NA 138 09/10/2021 1631   K 3.7 09/10/2021 1631   CL 99 09/10/2021 1631   CO2 28 09/10/2021 1631   GLUCOSE 94 09/10/2021 1631   BUN 13 09/10/2021 1631   CREATININE 1.23 09/10/2021 1631   CALCIUM 9.4 09/10/2021 1631   GFRNONAA 49 (L) 10/19/2018 1905   GFRAA 57 (L) 10/19/2018 1905   OSA CPAP use: off and on, has to get a new mask and new tubing Hypersomnia: depends on if he wakes up at night Well rested: generally yes when he uses his CPAP Did not see pulmonology after his last visit. He did not want to have another sleep study.   Obesity: Patient notes he has been working on cutting back on his foods.  He is skipping breakfast.  He is eating a healthy lunch with salad and chicken and or vegetables.  He is eating somewhat typical dinners.  He has been walking every other day for 30 minutes since our last visit.  Sore in mouth: Patient reports about a week ago he developed a sore in his right posterior mouth.  He notes it feels like a canker sore.  Notes it was uncomfortable when he swallowed.  He notes his right ear was uncomfortable as well.  He notes he took some ibuprofen yesterday and today it feels significantly better.   Social History   Tobacco Use  Smoking Status Never  Smokeless Tobacco Never    Current Outpatient Medications on File Prior to Visit  Medication Sig Dispense Refill   amLODipine (NORVASC) 10 MG tablet TAKE 1 TABLET BY MOUTH EVERY DAY 90 tablet 1   hydrochlorothiazide (HYDRODIURIL) 25 MG tablet TAKE 1 TABLET BY MOUTH EVERY DAY 90 tablet 1   losartan (COZAAR) 50 MG tablet TAKE 1 TABLET BY MOUTH EVERY DAY 90 tablet 3   polyethylene  glycol-electrolytes (NULYTELY) 420 g solution Prepare according to package instructions. Starting at 5:00 PM: Drink one 8 oz glass of mixture every 15 minutes until you finish half of the jug. Five hours prior to procedure, drink 8 oz glass of mixture every 15 minutes until it is all gone. Make sure you do not drink anything 4 hours prior to your procedure. 4000 mL 0   rosuvastatin (CRESTOR) 40 MG tablet TAKE 1 TABLET BY MOUTH EVERY DAY 90 tablet 1   sildenafil (VIAGRA) 100 MG tablet Take 1 tablet (100 mg total) by mouth daily as needed for erectile dysfunction. 10 tablet 1   VYVANSE 50 MG capsule Take 50 mg by mouth daily.     No current facility-administered medications on file prior to visit.     ROS see history of present illness  Objective  Physical Exam Vitals:   01/09/22 0859  BP: 110/78  Pulse: 82  Temp: 98.9 F (37.2 C)  SpO2: 99%    BP Readings from Last 3 Encounters:  01/09/22 110/78  09/20/21 (!) 138/100  09/13/21 133/86   Wt Readings from Last 3 Encounters:  01/09/22 266 lb 12.8 oz (121 kg)  09/20/21 279 lb 9.1 oz (126.8 kg)  09/13/21 281 lb (127.5 kg)    Physical Exam Constitutional:  General: He is not in acute distress.    Appearance: He is not diaphoretic.  HENT:     Left Ear: Tympanic membrane normal.     Ears:     Comments: Right TM with a small amount of clear fluid, no signs of infection    Mouth/Throat:     Mouth: Mucous membranes are moist.     Pharynx: Oropharynx is clear. No oropharyngeal exudate or posterior oropharyngeal erythema.  Cardiovascular:     Rate and Rhythm: Normal rate and regular rhythm.     Heart sounds: Normal heart sounds.  Pulmonary:     Effort: Pulmonary effort is normal.     Breath sounds: Normal breath sounds.  Skin:    General: Skin is warm and dry.  Neurological:     Mental Status: He is alert.     Assessment/Plan: Please see individual problem list.  Problem List Items Addressed This Visit      Hypertension    Well-controlled.  He will continue amlodipine 10 mg once daily, HCTZ 25 mg once daily, and losartan 50 mg once daily.      Obesity (BMI 35.0-39.9 without comorbidity)    The patient has lost about 1 pound per week since our last visit.  I congratulated him on this and encouraged him to continue with his dietary changes.  Discussed that he should change up his exercise routine and add in some strength training to see if that would aid in additional weight loss.  We did discuss weight loss medication specifically weight loss injections.  Advised that I would not recommend these at this time though if he stalled out on his weight loss journey we could consider this.  He notes no personal or family history of thyroid cancer, parathyroid cancer, or adrenal gland cancer.  I did discuss the risk of medullary thyroid cancer seen in rats studies.  Advised that this does not seem to be a risk in humans.  Discussed risk of pancreatitis and gallbladder disease.  He denies issues with those in the past.  Discussed risk of nausea.      Sleep apnea    I encouraged him to use his CPAP consistently.  He will work on replacing his mask and hose with his DME company.  If he needs anything from [place] regarding this he will let us know.      Sore in mouth    No abnormal findings in his oropharynx on exam.  He reports improvement in symptoms and thus he will monitor.  Discussed if it returns there could be some measure of eustachian tube dysfunction given the fluid behind his right TM.  Advised he could try Allegra or Zyrtec over-the-counter.  If it returns and is persistent despite use of Allegra or Zyrtec he will let us know so that we could refer him to ENT for direct visualization.       Return in about 3 months (around 04/08/2022) for Hypertension/weight.  This visit occurred during the SARS-CoV-2 public health emergency.  Safety protocols were in place, including screening questions prior to the  visit, additional usage of staff PPE, and extensive cleaning of exam room while observing appropriate contact time as indicated for disinfecting solutions.    [name], MD [place]

## 2022-01-09 NOTE — Assessment & Plan Note (Signed)
Well-controlled.  He will continue amlodipine 10 mg once daily, HCTZ 25 mg once daily, and losartan 50 mg once daily.

## 2022-01-09 NOTE — Assessment & Plan Note (Signed)
I encouraged him to use his CPAP consistently.  He will work on replacing his mask and hose with his DME company.  If he needs anything from [place] regarding this he will let us know.

## 2022-01-09 NOTE — Assessment & Plan Note (Signed)
The patient has lost about 1 pound per week since our last visit.  I congratulated him on this and encouraged him to continue with his dietary changes.  Discussed that he should change up his exercise routine and add in some strength training to see if that would aid in additional weight loss.  We did discuss weight loss medication specifically weight loss injections.  Advised that I would not recommend these at this time though if he stalled out on his weight loss journey we could consider this.  He notes no personal or family history of thyroid cancer, parathyroid cancer, or adrenal gland cancer.  I did discuss the risk of medullary thyroid cancer seen in rats studies.  Advised that this does not seem to be a risk in humans.  Discussed risk of pancreatitis and gallbladder disease.  He denies issues with those in the past.  Discussed risk of nausea.

## 2022-01-09 NOTE — Patient Instructions (Signed)
Nice to see you. We will follow-up in 3 months for your weight.  Please change up your exercise routine.  Please continue to work on diet and exercise. Please try to get a new face mask and hose for your CPAP.  If you need our help regarding this please let me know.

## 2022-01-09 NOTE — Assessment & Plan Note (Signed)
No abnormal findings in his oropharynx on exam.  He reports improvement in symptoms and thus he will monitor.  Discussed if it returns there could be some measure of eustachian tube dysfunction given the fluid behind his right TM.  Advised he could try Allegra or Zyrtec over-the-counter.  If it returns and is persistent despite use of Allegra or Zyrtec he will let us know so that we could refer him to ENT for direct visualization.

## 2022-04-12 ENCOUNTER — Telehealth: Payer: BC Managed Care – PPO | Admitting: Family Medicine

## 2022-04-12 ENCOUNTER — Encounter: Payer: Self-pay | Admitting: Family Medicine

## 2022-04-12 VITALS — Ht 70.0 in | Wt 266.0 lb

## 2022-04-12 DIAGNOSIS — K529 Noninfective gastroenteritis and colitis, unspecified: Secondary | ICD-10-CM | POA: Insufficient documentation

## 2022-04-12 DIAGNOSIS — I10 Essential (primary) hypertension: Secondary | ICD-10-CM

## 2022-04-12 DIAGNOSIS — E669 Obesity, unspecified: Secondary | ICD-10-CM

## 2022-04-12 DIAGNOSIS — M791 Myalgia, unspecified site: Secondary | ICD-10-CM | POA: Diagnosis not present

## 2022-04-12 DIAGNOSIS — E785 Hyperlipidemia, unspecified: Secondary | ICD-10-CM | POA: Diagnosis not present

## 2022-04-12 NOTE — Assessment & Plan Note (Signed)
Undetermined control.  The patient will come in for lab work and a nurse blood pressure check.  He will continue amlodipine 10 mg once daily, HCTZ 25 mg daily, and losartan 50 mg daily. ?

## 2022-04-12 NOTE — Progress Notes (Signed)
? ?Virtual Visit via video Note ? ?This visit type was conducted due to national recommendations for restrictions regarding the COVID-19 pandemic (e.g. social distancing).  This format is felt to be most appropriate for this patient at this time.  All issues noted in this document were discussed and addressed.  No physical exam was performed (except for noted visual exam findings with Video Visits).  ? ?I connected with Benjamin Summers today at  9:00 AM EDT by a video enabled telemedicine application and verified that I am speaking with the correct person using two identifiers. ?Location patient: home ?Location provider: work  ?Persons participating in the virtual visit: patient, provider, Izack Hoogland (wife) ? ?I discussed the limitations, risks, security and privacy concerns of performing an evaluation and management service by telephone and the availability of in person appointments. I also discussed with the patient that there may be a patient responsible charge related to this service. The patient expressed understanding and agreed to proceed. ? ?Reason for visit: f/u. ? ?HPI: ?Vomiting and diarrhea: The patient reports that his son was sick on Wednesday about 5 hours after his son came home the patient got sick with vomiting and diarrhea.  He has abdominal cramps before having diarrhea.  No blood in stool.  His vomit is nonbloody and nonbilious.  His urine is light yellow.  He has had no fevers.  He is trying to stay hydrated.  Typically throws up about an hour after taking in food or liquids.  He felt a little better yesterday afternoon though today feels a little worse. ? ?HYPERTENSION ?Disease Monitoring ?Home BP Monitoring not checking recently Chest pain- no    Dyspnea- no ?Medications ?Compliance-  taking amlodipine, HCTZ, losartan.   Edema- no ?BMET ?   ?Component Value Date/Time  ? NA 138 09/10/2021 1631  ? K 3.7 09/10/2021 1631  ? CL 99 09/10/2021 1631  ? CO2 28 09/10/2021 1631  ? GLUCOSE 94 09/10/2021  1631  ? BUN 13 09/10/2021 1631  ? CREATININE 1.23 09/10/2021 1631  ? CALCIUM 9.4 09/10/2021 1631  ? GFRNONAA 49 (L) 10/19/2018 1905  ? GFRAA 57 (L) 10/19/2018 1905  ? ?HYPERLIPIDEMIA ?Symptoms ?Chest pain on exertion:  no   Leg claudication:   no ?Medications: ?Compliance- taking crestor Right upper quadrant pain- no  Muscle aches- some in the morning when he wakes up, not sure if this started before or after going on cholesterol medication, notes it has been an issue for years ?Lipid Panel  ?   ?Component Value Date/Time  ? CHOL 224 (H) 09/10/2021 1631  ? TRIG 267.0 (H) 09/10/2021 1631  ? HDL 48.80 09/10/2021 1631  ? CHOLHDL 5 09/10/2021 1631  ? VLDL 53.4 (H) 09/10/2021 1631  ? LDLCALC 112 (H) 12/06/2019 2440  ? LDLDIRECT 110.0 10/29/2021 0837  ? ? ? ? ?ROS: See pertinent positives and negatives per HPI. ? ?Past Medical History:  ?Diagnosis Date  ? Depression   ? Elevated cholesterol   ? Hypertension   ? Vaginal infection   ? ? ?Past Surgical History:  ?Procedure Laterality Date  ? COLONOSCOPY WITH PROPOFOL N/A 09/20/2021  ? Procedure: COLONOSCOPY WITH PROPOFOL;  Surgeon: Jonathon Bellows, MD;  Location: [facility] ENDOSCOPY;  Service: Gastroenterology;  Laterality: N/A;  ? ? ?Family History  ?Problem Relation Age of Onset  ? Hypertension Mother   ? Diabetes Mother   ? ? ?SOCIAL HX: nonsmoker ? ? ?Current Outpatient Medications:  ?  amLODipine (NORVASC) 10 MG tablet, TAKE 1 TABLET  BY MOUTH EVERY DAY, Disp: 90 tablet, Rfl: 1 ?  hydrochlorothiazide (HYDRODIURIL) 25 MG tablet, TAKE 1 TABLET BY MOUTH EVERY DAY, Disp: 90 tablet, Rfl: 1 ?  losartan (COZAAR) 50 MG tablet, TAKE 1 TABLET BY MOUTH EVERY DAY, Disp: 90 tablet, Rfl: 3 ?  polyethylene glycol-electrolytes (NULYTELY) 420 g solution, Prepare according to package instructions. Starting at 5:00 PM: Drink one 8 oz glass of mixture every 15 minutes until you finish half of the jug. Five hours prior to procedure, drink 8 oz glass of mixture every 15 minutes until it is all gone.  Make sure you do not drink anything 4 hours prior to your procedure., Disp: 4000 mL, Rfl: 0 ?  rosuvastatin (CRESTOR) 40 MG tablet, TAKE 1 TABLET BY MOUTH EVERY DAY, Disp: 90 tablet, Rfl: 1 ?  sildenafil (VIAGRA) 100 MG tablet, Take 1 tablet (100 mg total) by mouth daily as needed for erectile dysfunction., Disp: 10 tablet, Rfl: 1 ?  VYVANSE 50 MG capsule, Take 50 mg by mouth daily., Disp: , Rfl:  ?  WEGOVY 0.25 MG/0.5ML SOAJ, Inject into the skin., Disp: , Rfl:  ?  WEGOVY 0.5 MG/0.5ML SOAJ, SMARTSIG:1 unspecified SUB-Q Once a Week, Disp: , Rfl:  ?  WEGOVY 1 MG/0.5ML SOAJ, Inject into the skin once a week., Disp: , Rfl:  ? ?EXAM: ? ?VITALS per patient if applicable: ? ?GENERAL: alert, oriented, appears tired, laying on the couch, in no acute distress ? ?HEENT: atraumatic, conjunttiva clear, no obvious abnormalities on inspection of external nose and ears ? ?NECK: normal movements of the head and neck ? ?LUNGS: on inspection no signs of respiratory distress, breathing rate appears normal, no obvious gross SOB, gasping or wheezing ? ?CV: no obvious cyanosis ? ?MS: moves all visible extremities without noticeable abnormality ? ?PSYCH/NEURO: pleasant and cooperative, no obvious depression or anxiety, speech and thought processing grossly intact ? ?ASSESSMENT AND PLAN: ? ?Discussed the following assessment and plan: ? ?Problem List Items Addressed This Visit   ? ? Hyperlipidemia (Chronic)  ?  He will continue Crestor 40 mg once daily.  We will check a CK to evaluate his myalgias. ? ?  ?  ? Hypertension (Chronic)  ?  Undetermined control.  The patient will come in for lab work and a nurse blood pressure check.  He will continue amlodipine 10 mg once daily, HCTZ 25 mg daily, and losartan 50 mg daily. ? ?  ?  ? Relevant Orders  ? Basic Metabolic Panel (BMET)  ? Obesity (BMI 35.0-39.9 without comorbidity) (Chronic)  ?  The patient was started on Wegovy by an outside provider.  He notes no personal or family history of  thyroid cancer, parathyroid cancer, or adrenal gland cancer.  No history of pancreatitis or gallbladder disease. ? ?The patient was counseled on the risk of pancreatitis and gallbladder disease.  Discussed the risk of nausea.  They were advised to discontinue the [facility] and contact us immediately if they develop abdominal pain.  If they develop excessive nausea they will contact us right away.  I discussed that medullary thyroid cancer has been seen in rats studies.  The patient confirmed no personal or family history of thyroid cancer, parathyroid cancer, or adrenal gland cancer.  Discussed that we thus far have not seen medullary thyroid cancer result from use of this type of medication in humans.  They were advised to monitor the thyroid area and contact us for any lumps, swelling, trouble swallowing, or any other changes in this  area. ? ?  ?  ? Relevant Medications  ? WEGOVY 0.25 MG/0.5ML SOAJ  ? WEGOVY 0.5 MG/0.5ML SOAJ  ? WEGOVY 1 MG/0.5ML SOAJ  ? Gastroenteritis  ?  The patient likely has viral gastroenteritis.  Discussed the most important thing is staying hydrated.  I encouraged adequate intake of noncaffeinated beverages.  Discussed he could also take in things like chicken broth.  Discussed that most viral gastroenteritis resolves within a day or so.  Advised if he is not improving over the course of today he should certainly be evaluated in person this weekend.  If he does not get evaluated and is not feeling better by next week he will let us know. ? ?  ?  ? ?Other Visit Diagnoses   ? ? Myalgia    -  Primary  ? Relevant Orders  ? CK (Creatine Kinase)  ? ?  ? ? ?Return in about 2 weeks (around 04/26/2022) for labs and nurse BP check, 6 months PCP for HTN, HLD, and predm. ?  ?I discussed the assessment and treatment plan with the patient. The patient was provided an opportunity to ask questions and all were answered. The patient agreed with the plan and demonstrated an understanding of the instructions. ?   ?The patient was advised to call back or seek an in-person evaluation if the symptoms worsen or if the condition fails to improve as anticipated. ? ? ? ?[name], MD  ? ?

## 2022-04-12 NOTE — Assessment & Plan Note (Signed)
The patient likely has viral gastroenteritis.  Discussed the most important thing is staying hydrated.  I encouraged adequate intake of noncaffeinated beverages.  Discussed he could also take in things like chicken broth.  Discussed that most viral gastroenteritis resolves within a day or so.  Advised if he is not improving over the course of today he should certainly be evaluated in person this weekend.  If he does not get evaluated and is not feeling better by next week he will let us know. ?

## 2022-04-12 NOTE — Assessment & Plan Note (Signed)
The patient was started on Wegovy by an outside provider.  He notes no personal or family history of thyroid cancer, parathyroid cancer, or adrenal gland cancer.  No history of pancreatitis or gallbladder disease. ? ?The patient was counseled on the risk of pancreatitis and gallbladder disease.  Discussed the risk of nausea.  They were advised to discontinue the [facility] and contact us immediately if they develop abdominal pain.  If they develop excessive nausea they will contact us right away.  I discussed that medullary thyroid cancer has been seen in rats studies.  The patient confirmed no personal or family history of thyroid cancer, parathyroid cancer, or adrenal gland cancer.  Discussed that we thus far have not seen medullary thyroid cancer result from use of this type of medication in humans.  They were advised to monitor the thyroid area and contact us for any lumps, swelling, trouble swallowing, or any other changes in this area. ?

## 2022-04-12 NOTE — Assessment & Plan Note (Signed)
He will continue Crestor 40 mg once daily.  We will check a CK to evaluate his myalgias. ?

## 2022-04-15 ENCOUNTER — Telehealth: Payer: Self-pay | Admitting: Family Medicine

## 2022-04-15 NOTE — Telephone Encounter (Signed)
Called Pt to schedule follow up appt. Pt sent me to VM... [facility] office...  Return in about 2 weeks (around 04/26/2022) for labs and nurse BP check, 6 months PCP for HTN, HLD, and predm. ?

## 2022-06-19 ENCOUNTER — Other Ambulatory Visit: Payer: Self-pay | Admitting: Family Medicine

## 2022-06-19 DIAGNOSIS — E785 Hyperlipidemia, unspecified: Secondary | ICD-10-CM

## 2022-06-19 DIAGNOSIS — I10 Essential (primary) hypertension: Secondary | ICD-10-CM

## 2023-02-26 ENCOUNTER — Ambulatory Visit: Payer: BC Managed Care – PPO | Admitting: Family Medicine

## 2023-02-26 ENCOUNTER — Encounter: Payer: Self-pay | Admitting: Family Medicine

## 2023-02-26 VITALS — BP 108/78 | HR 83 | Temp 98.6°F | Ht 70.0 in | Wt 263.2 lb

## 2023-02-26 DIAGNOSIS — Z125 Encounter for screening for malignant neoplasm of prostate: Secondary | ICD-10-CM | POA: Diagnosis not present

## 2023-02-26 DIAGNOSIS — M5432 Sciatica, left side: Secondary | ICD-10-CM

## 2023-02-26 DIAGNOSIS — M5430 Sciatica, unspecified side: Secondary | ICD-10-CM | POA: Insufficient documentation

## 2023-02-26 DIAGNOSIS — I10 Essential (primary) hypertension: Secondary | ICD-10-CM | POA: Diagnosis not present

## 2023-02-26 DIAGNOSIS — E785 Hyperlipidemia, unspecified: Secondary | ICD-10-CM

## 2023-02-26 DIAGNOSIS — E669 Obesity, unspecified: Secondary | ICD-10-CM

## 2023-02-26 DIAGNOSIS — M5431 Sciatica, right side: Secondary | ICD-10-CM | POA: Diagnosis not present

## 2023-02-26 LAB — COMPREHENSIVE METABOLIC PANEL
ALT: 14 U/L (ref 0–53)
AST: 13 U/L (ref 0–37)
Albumin: 4.6 g/dL (ref 3.5–5.2)
Alkaline Phosphatase: 47 U/L (ref 39–117)
BUN: 12 mg/dL (ref 6–23)
CO2: 27 mEq/L (ref 19–32)
Calcium: 9.8 mg/dL (ref 8.4–10.5)
Chloride: 100 mEq/L (ref 96–112)
Creatinine, Ser: 1.2 mg/dL (ref 0.40–1.50)
GFR: 73.02 mL/min (ref >60.00)
Glucose, Bld: 85 mg/dL (ref 70–99)
Potassium: 3.7 mEq/L (ref 3.5–5.1)
Sodium: 137 mEq/L (ref 135–145)
Total Bilirubin: 0.7 mg/dL (ref 0.2–1.2)
Total Protein: 7.6 g/dL (ref 6.0–8.3)

## 2023-02-26 LAB — LIPID PANEL
Cholesterol: 157 mg/dL (ref 0–200)
HDL: 46.3 mg/dL (ref >39.00)
LDL Cholesterol: 92 mg/dL (ref 0–99)
NonHDL: 111.16
Total CHOL/HDL Ratio: 3
Triglycerides: 96 mg/dL (ref 0.0–149.0)
VLDL: 19.2 mg/dL (ref 0.0–40.0)

## 2023-02-26 LAB — HEMOGLOBIN A1C: Hgb A1c MFr Bld: 6.1 % (ref 4.6–6.5)

## 2023-02-26 LAB — PSA: PSA: 0.52 ng/mL (ref 0.10–4.00)

## 2023-02-26 MED ORDER — AMLODIPINE BESYLATE 10 MG PO TABS: 10.0000 mg | ORAL_TABLET | Freq: Every day | ORAL | 3 refills | Status: DC

## 2023-02-26 MED ORDER — LOSARTAN POTASSIUM 50 MG PO TABS: 50.0000 mg | ORAL_TABLET | Freq: Every day | ORAL | 3 refills | Status: DC

## 2023-02-26 MED ORDER — ROSUVASTATIN CALCIUM 40 MG PO TABS: 40.0000 mg | ORAL_TABLET | Freq: Every day | ORAL | 3 refills | Status: DC

## 2023-02-26 MED ORDER — PREDNISONE 20 MG PO TABS: 40.0000 mg | ORAL_TABLET | Freq: Every day | ORAL | 0 refills | Status: DC

## 2023-02-26 MED ORDER — HYDROCHLOROTHIAZIDE 25 MG PO TABS: 25.0000 mg | ORAL_TABLET | Freq: Every day | ORAL | 3 refills | Status: DC

## 2023-02-26 NOTE — Assessment & Plan Note (Signed)
Symptoms seem consistent with sciatica.  This could also represent piriformis syndrome.  We will treat with prednisone 40 mg daily for 5 days.  Discussed the risk of sleep issues, appetite increased, and agitation.  He will discontinue the medication if he has excessive sleep issues or agitation.  I will also refer to physical therapy.  Patient will see if this is affordable for him.

## 2023-02-26 NOTE — Patient Instructions (Signed)
Nice to see you. We will get lab work today and contact you with results. Please try the prednisone to see if it helps with your sciatica. PT will contact you to set up physical therapy.  If you do not hear from them within the next week or 2 please let us know.

## 2023-02-26 NOTE — Assessment & Plan Note (Signed)
Chronic issue.  Continue Crestor 40 mg daily.  Check lab work today.

## 2023-02-26 NOTE — Progress Notes (Signed)
Benjamin Rumps, MD Phone: [phone]  Benjamin Summers is a 47 y.o. male who presents today for f/u.  HYPERTENSION Disease Monitoring Home BP Monitoring "decent control" Chest pain- no    Dyspnea- no Medications Compliance-  taking amlodipine, HCTZ, losartan.  Edema- no BMET    Component Value Date/Time   NA 138 09/10/2021 1631   K 3.7 09/10/2021 1631   CL 99 09/10/2021 1631   CO2 28 09/10/2021 1631   GLUCOSE 94 09/10/2021 1631   BUN 13 09/10/2021 1631   CREATININE 1.23 09/10/2021 1631   CALCIUM 9.4 09/10/2021 1631   GFRNONAA 49 (L) 10/19/2018 1905   GFRAA 57 (L) 10/19/2018 1905   HYPERLIPIDEMIA Symptoms Chest pain on exertion:  no   Medications: Compliance- taking crestor Right upper quadrant pain- no  Muscle aches- no Lipid Panel     Component Value Date/Time   CHOL 224 (H) 09/10/2021 1631   TRIG 267.0 (H) 09/10/2021 1631   HDL 48.80 09/10/2021 1631   CHOLHDL 5 09/10/2021 1631   VLDL 53.4 (H) 09/10/2021 1631   LDLCALC 112 (H) 12/06/2019 0806   LDLDIRECT 110.0 10/29/2021 0837   Sciatica: Patient notes this has been going on for a number of years.  Notes it has been worse the last couple of months.  At times it is 9/10.  Occasionally feels his legs buckle.  Notes the discomfort starts in his buttocks and goes down his legs bilaterally.  He does note some chronic low back pain.  He notes no injury.  He occasionally takes ibuprofen with some benefit.  No prescription medications have been tried.  He has not done physical therapy.  He notes this more when he is walking.  Obesity: Patient notes he walks with work.  He is trying to limit carbohydrates.  He is eating a lot of vegetables and lean meats.  Not very many sweets.  No soda or sweet tea.  Social History   Tobacco Use  Smoking Status Never  Smokeless Tobacco Never    Current Outpatient Medications on File Prior to Visit  Medication Sig Dispense Refill   polyethylene glycol-electrolytes (NULYTELY) 420 g solution  Prepare according to package instructions. Starting at 5:00 PM: Drink one 8 oz glass of mixture every 15 minutes until you finish half of the jug. Five hours prior to procedure, drink 8 oz glass of mixture every 15 minutes until it is all gone. Make sure you do not drink anything 4 hours prior to your procedure. 4000 mL 0   sildenafil (VIAGRA) 100 MG tablet Take 1 tablet (100 mg total) by mouth daily as needed for erectile dysfunction. 10 tablet 1   VYVANSE 50 MG capsule Take 50 mg by mouth daily.     No current facility-administered medications on file prior to visit.     ROS see history of present illness  Objective  Physical Exam Vitals:   02/26/23 0919  BP: 108/78  Pulse: 83  Temp: 98.6 F (37 C)  SpO2: 98%    BP Readings from Last 3 Encounters:  02/26/23 108/78  01/09/22 110/78  09/20/21 (!) 138/100   Wt Readings from Last 3 Encounters:  02/26/23 263 lb 3.2 oz (119.4 kg)  04/12/22 266 lb (120.7 kg)  01/09/22 266 lb 12.8 oz (121 kg)    Physical Exam Constitutional:      General: He is not in acute distress.    Appearance: He is not diaphoretic.  Cardiovascular:     Rate and Rhythm: Normal rate and  regular rhythm.     Heart sounds: Normal heart sounds.  Pulmonary:     Effort: Pulmonary effort is normal.     Breath sounds: Normal breath sounds.  Musculoskeletal:     Comments: No midline spine tenderness, no midline spine step-off, no muscular back tenderness, negative slump test bilaterally  Skin:    General: Skin is warm and dry.  Neurological:     Mental Status: He is alert.      Assessment/Plan: Please see individual problem list.  Primary hypertension Assessment & Plan: Chronic issue.  Very well-controlled.  Patient will continue amlodipine 10 mg daily, HCTZ 25 mg daily, and losartan 50 mg daily.  Check labs.  Orders: -     amLODIPine Besylate; Take 1 tablet (10 mg total) by mouth daily.  Dispense: 90 tablet; Refill: 3 -     hydroCHLOROthiazide; Take  1 tablet (25 mg total) by mouth daily.  Dispense: 90 tablet; Refill: 3 -     Losartan Potassium; Take 1 tablet (50 mg total) by mouth daily.  Dispense: 90 tablet; Refill: 3 -     Comprehensive metabolic panel  Hyperlipidemia, unspecified hyperlipidemia type Assessment & Plan: Chronic issue.  Continue Crestor 40 mg daily.  Check lab work today.  Orders: -     Rosuvastatin Calcium; Take 1 tablet (40 mg total) by mouth daily.  Dispense: 90 tablet; Refill: 3 -     Comprehensive metabolic panel -     Lipid panel  Obesity (BMI 35.0-39.9 without comorbidity) Assessment & Plan: Chronic issue.  Encouraged to continue walking for exercise.  Encouraged healthy diet.  Orders: -     Hemoglobin A1c  Bilateral sciatica Assessment & Plan: Symptoms seem consistent with sciatica.  This could also represent piriformis syndrome.  We will treat with prednisone 40 mg daily for 5 days.  Discussed the risk of sleep issues, appetite increased, and agitation.  He will discontinue the medication if he has excessive sleep issues or agitation.  I will also refer to physical therapy.  Patient will see if this is affordable for him.  Orders: -     predniSONE; Take 2 tablets (40 mg total) by mouth daily with breakfast.  Dispense: 10 tablet; Refill: 0 -     Ambulatory referral to Physical Therapy  Prostate cancer screening -     PSA    Return in about 6 months (around 08/29/2023) for Hypertension.   Benjamin Rumps, MD [place]

## 2023-02-26 NOTE — Assessment & Plan Note (Signed)
Chronic issue.  Very well-controlled.  Patient will continue amlodipine 10 mg daily, HCTZ 25 mg daily, and losartan 50 mg daily.  Check labs.

## 2023-02-26 NOTE — Assessment & Plan Note (Signed)
Chronic issue.  Encouraged to continue walking for exercise.  Encouraged healthy diet.

## 2023-03-03 ENCOUNTER — Telehealth: Payer: Self-pay

## 2023-03-03 NOTE — Telephone Encounter (Signed)
-----   Message from Leone Haven, MD sent at 03/03/2023  9:58 AM EDT ----- Please relay the mychart result message to the patient.

## 2023-03-03 NOTE — Telephone Encounter (Signed)
Left message to call the office back regarding lab results  

## 2023-03-04 NOTE — Telephone Encounter (Signed)
Pt returned [place] call. Note from labs result from provider was read to pt. Pt aware.

## 2023-03-10 ENCOUNTER — Telehealth: Payer: Self-pay

## 2023-03-10 NOTE — Telephone Encounter (Signed)
Left message to call the office back.

## 2023-03-10 NOTE — Telephone Encounter (Signed)
-----   Message from Eric G Sonnenberg, MD sent at 03/03/2023  9:58 AM EDT ----- Please relay the mychart result message to the patient.  

## 2023-07-24 ENCOUNTER — Encounter (INDEPENDENT_AMBULATORY_CARE_PROVIDER_SITE_OTHER): Payer: Self-pay

## 2023-08-29 ENCOUNTER — Ambulatory Visit: Payer: BC Managed Care – PPO | Admitting: Family Medicine

## 2023-08-29 ENCOUNTER — Encounter: Payer: Self-pay | Admitting: Family Medicine

## 2023-08-29 VITALS — BP 132/82 | HR 72 | Temp 98.6°F | Ht 70.0 in | Wt 250.8 lb

## 2023-08-29 DIAGNOSIS — E785 Hyperlipidemia, unspecified: Secondary | ICD-10-CM | POA: Diagnosis not present

## 2023-08-29 DIAGNOSIS — I10 Essential (primary) hypertension: Secondary | ICD-10-CM | POA: Diagnosis not present

## 2023-08-29 DIAGNOSIS — M5432 Sciatica, left side: Secondary | ICD-10-CM | POA: Diagnosis not present

## 2023-08-29 DIAGNOSIS — M5431 Sciatica, right side: Secondary | ICD-10-CM

## 2023-08-29 LAB — BASIC METABOLIC PANEL
BUN: 11 mg/dL (ref 6–23)
CO2: 29 mEq/L (ref 19–32)
Calcium: 9.2 mg/dL (ref 8.4–10.5)
Chloride: 100 mEq/L (ref 96–112)
Creatinine, Ser: 1.25 mg/dL (ref 0.40–1.50)
GFR: 69.28 mL/min (ref >60.00)
Glucose, Bld: 92 mg/dL (ref 70–99)
Potassium: 3.5 mEq/L (ref 3.5–5.1)
Sodium: 139 mEq/L (ref 135–145)

## 2023-08-29 NOTE — Assessment & Plan Note (Signed)
Chronic issue.  Having some myalgias which could be related to the Crestor.  He will hold this for 2 weeks and see how it is going.  He will send me a message in 2 weeks through MyChart to let me know how his symptoms have gone off of the Crestor.  Discussed we could consider alternative statins if needed.

## 2023-08-29 NOTE — Assessment & Plan Note (Signed)
Chronic issue.  Improving some.  Patient will continue stretching.  We will see if symptoms improve off of Crestor and if not could consider MRI to evaluate further.

## 2023-08-29 NOTE — Progress Notes (Signed)
Marikay Alar, MD Phone: [phone]  Benjamin Summers is a 47 y.o. male who presents today for f/u.  HYPERTENSION Disease Monitoring Home BP Monitoring not checking Chest pain- no    Dyspnea- no Medications Compliance-  taking amlodipine, hydrochlorothiazide, losartan.  Edema- no BMET    Component Value Date/Time   NA 137 02/26/2023 0939   K 3.7 02/26/2023 0939   CL 100 02/26/2023 0939   CO2 27 02/26/2023 0939   GLUCOSE 85 02/26/2023 0939   BUN 12 02/26/2023 0939   CREATININE 1.20 02/26/2023 0939   CALCIUM 9.8 02/26/2023 0939   GFRNONAA 49 (L) 10/19/2018 1905   GFRAA 57 (L) 10/19/2018 1905   Sciatica/myalgia: Patient notes this has improved to a certain degree with PT, stretching, and using a TENS unit.  He notes at times he has sharp pain shooting down his leg from his buttock though other times he just has soreness in his legs.  He wonders if his Crestor is contributing to the soreness.  Social History   Tobacco Use  Smoking Status Never  Smokeless Tobacco Never    Current Outpatient Medications on File Prior to Visit  Medication Sig Dispense Refill   amLODipine (NORVASC) 10 MG tablet Take 1 tablet (10 mg total) by mouth daily. 90 tablet 3   hydrochlorothiazide (HYDRODIURIL) 25 MG tablet Take 1 tablet (25 mg total) by mouth daily. 90 tablet 3   losartan (COZAAR) 50 MG tablet Take 1 tablet (50 mg total) by mouth daily. 90 tablet 3   polyethylene glycol-electrolytes (NULYTELY) 420 g solution Prepare according to package instructions. Starting at 5:00 PM: Drink one 8 oz glass of mixture every 15 minutes until you finish half of the jug. Five hours prior to procedure, drink 8 oz glass of mixture every 15 minutes until it is all gone. Make sure you do not drink anything 4 hours prior to your procedure. 4000 mL 0   predniSONE (DELTASONE) 20 MG tablet Take 2 tablets (40 mg total) by mouth daily with breakfast. 10 tablet 0   rosuvastatin (CRESTOR) 40 MG tablet Take 1 tablet (40 mg  total) by mouth daily. 90 tablet 3   VYVANSE 50 MG capsule Take 50 mg by mouth daily.     No current facility-administered medications on file prior to visit.     ROS see history of present illness  Objective  Physical Exam Vitals:   08/29/23 0935 08/29/23 0949  BP: (!) 140/84 132/82  Pulse: 72   Temp: 98.6 F (37 C)   SpO2: 98%     BP Readings from Last 3 Encounters:  08/29/23 132/82  02/26/23 108/78  01/09/22 110/78   Wt Readings from Last 3 Encounters:  08/29/23 250 lb 12.8 oz (113.8 kg)  02/26/23 263 lb 3.2 oz (119.4 kg)  04/12/22 266 lb (120.7 kg)    Physical Exam Constitutional:      General: He is not in acute distress.    Appearance: He is not diaphoretic.  Cardiovascular:     Rate and Rhythm: Normal rate and regular rhythm.     Heart sounds: Normal heart sounds.  Pulmonary:     Effort: Pulmonary effort is normal.     Breath sounds: Normal breath sounds.  Skin:    General: Skin is warm and dry.  Neurological:     Mental Status: He is alert.      Assessment/Plan: Please see individual problem list.  Primary hypertension Assessment & Plan: Chronic issue.  Improved on recheck though still  slightly above goal.  Patient will start checking at home and follow-up in 3 months.  Discussed consideration of increasing BP medication if blood pressures consistently greater than 130/80 at home.  Orders: -     Basic metabolic panel  Hyperlipidemia, unspecified hyperlipidemia type Assessment & Plan: Chronic issue.  Having some myalgias which could be related to the Crestor.  He will hold this for 2 weeks and see how it is going.  He will send me a message in 2 weeks through MyChart to let me know how his symptoms have gone off of the Crestor.  Discussed we could consider alternative statins if needed.   Bilateral sciatica Assessment & Plan: Chronic issue.  Improving some.  Patient will continue stretching.  We will see if symptoms improve off of Crestor and  if not could consider MRI to evaluate further.      Return in about 3 months (around 11/28/2023) for Hypertension.   Marikay Alar, MD [facility]

## 2023-08-29 NOTE — Assessment & Plan Note (Signed)
Chronic issue.  Improved on recheck though still slightly above goal.  Patient will start checking at home and follow-up in 3 months.  Discussed consideration of increasing BP medication if blood pressures consistently greater than 130/80 at home.

## 2023-11-28 ENCOUNTER — Ambulatory Visit: Payer: BC Managed Care – PPO | Admitting: Family Medicine

## 2023-11-28 ENCOUNTER — Encounter: Payer: Self-pay | Admitting: Family Medicine

## 2023-11-28 VITALS — BP 130/84 | HR 74 | Temp 98.6°F | Ht 70.0 in | Wt 267.6 lb

## 2023-11-28 DIAGNOSIS — I10 Essential (primary) hypertension: Secondary | ICD-10-CM

## 2023-11-28 DIAGNOSIS — B351 Tinea unguium: Secondary | ICD-10-CM | POA: Diagnosis not present

## 2023-11-28 DIAGNOSIS — E785 Hyperlipidemia, unspecified: Secondary | ICD-10-CM | POA: Diagnosis not present

## 2023-11-28 MED ORDER — LOSARTAN POTASSIUM 100 MG PO TABS: 100.0000 mg | ORAL_TABLET | Freq: Every day | ORAL | 1 refills | Status: DC

## 2023-11-28 MED ORDER — PRAVASTATIN SODIUM 40 MG PO TABS: 40.0000 mg | ORAL_TABLET | Freq: Every day | ORAL | 1 refills | Status: DC

## 2023-11-28 NOTE — Patient Instructions (Signed)
Nice to see you. We are going to switch you from rosuvastatin to pravastatin.  Please try co-Q10 over-the-counter to see if this will help minimize muscle or joint aches with the pravastatin. We are also increasing your losartan.

## 2023-11-28 NOTE — Assessment & Plan Note (Signed)
Chronic issue.  Has seen dermatology in the past.  Patient opts to monitor now.  Did briefly discuss topical treatment versus oral treatment versus monitoring.

## 2023-11-28 NOTE — Assessment & Plan Note (Signed)
Chronic issue.  We will switch to pravastatin 40 mg daily.  He will try starting co-Q10 over-the-counter to see if this will help with prevention of myalgias.  Plan to recheck labs in 6 weeks.

## 2023-11-28 NOTE — Progress Notes (Signed)
Marikay Alar, MD Phone: [phone]  Benjamin Summers is a 47 y.o. male who presents today for follow-up.  Hypertension: Typically 120s over 80s.  Taking amlodipine, HCTZ, and losartan.  No chest pain, shortness of breath, or edema.  Hyperlipidemia: Taking Crestor.  Notes he stopped the Crestor for a couple of weeks to see if it helped with his achiness.  He notes it did seem to help.  Patient walks some for exercise but not consistently.  Notes his diet is not been great recently.  Toenail deformity: Patient notes this has been present for quite some time.  He thinks it may be started after he was on Lipitor previously.  He reports seeing dermatology previously and they tried topical treatment for it.  Notes this is on his left great toe and right fourth toe.  Social History   Tobacco Use  Smoking Status Never  Smokeless Tobacco Never    Current Outpatient Medications on File Prior to Visit  Medication Sig Dispense Refill   amLODipine (NORVASC) 10 MG tablet Take 1 tablet (10 mg total) by mouth daily. 90 tablet 3   hydrochlorothiazide (HYDRODIURIL) 25 MG tablet Take 1 tablet (25 mg total) by mouth daily. 90 tablet 3   polyethylene glycol-electrolytes (NULYTELY) 420 g solution Prepare according to package instructions. Starting at 5:00 PM: Drink one 8 oz glass of mixture every 15 minutes until you finish half of the jug. Five hours prior to procedure, drink 8 oz glass of mixture every 15 minutes until it is all gone. Make sure you do not drink anything 4 hours prior to your procedure. 4000 mL 0   predniSONE (DELTASONE) 20 MG tablet Take 2 tablets (40 mg total) by mouth daily with breakfast. 10 tablet 0   VYVANSE 50 MG capsule Take 50 mg by mouth daily.     No current facility-administered medications on file prior to visit.     ROS see history of present illness  Objective  Physical Exam Vitals:   11/28/23 0810  BP: 130/84  Pulse: 74  Temp: 98.6 F (37 C)  SpO2: 97%     BP Readings from Last 3 Encounters:  11/28/23 130/84  08/29/23 132/82  02/26/23 108/78   Wt Readings from Last 3 Encounters:  11/28/23 267 lb 9.6 oz (121.4 kg)  08/29/23 250 lb 12.8 oz (113.8 kg)  02/26/23 263 lb 3.2 oz (119.4 kg)    Physical Exam Constitutional:      General: He is not in acute distress.    Appearance: He is not diaphoretic.  Cardiovascular:     Rate and Rhythm: Normal rate and regular rhythm.     Heart sounds: Normal heart sounds.  Pulmonary:     Effort: Pulmonary effort is normal.     Breath sounds: Normal breath sounds.  Skin:    General: Skin is warm and dry.     Comments: Left great toe and right fourth toe with discolored thickened nails  Neurological:     Mental Status: He is alert.      Assessment/Plan: Please see individual problem list.  Primary hypertension Assessment & Plan: Chronic issue.  Uncontrolled.  Discussed goal blood pressure less than 130/80.  Patient will increase losartan to 100 mg daily.  He will continue HCTZ 25 mg daily and amlodipine 10 mg daily.  Also encouraged healthier diet and increasing activity level.  Follow-up in 7 to 14 days with me.  Orders: -     Losartan Potassium; Take 1 tablet (100 mg  total) by mouth daily.  Dispense: 90 tablet; Refill: 1  Hyperlipidemia, unspecified hyperlipidemia type Assessment & Plan: Chronic issue.  We will switch to pravastatin 40 mg daily.  He will try starting co-Q10 over-the-counter to see if this will help with prevention of myalgias.  Plan to recheck labs in 6 weeks.  Orders: -     Pravastatin Sodium; Take 1 tablet (40 mg total) by mouth daily.  Dispense: 90 tablet; Refill: 1  Onychomycosis Assessment & Plan: Chronic issue.  Has seen dermatology in the past.  Patient opts to monitor now.  Did briefly discuss topical treatment versus oral treatment versus monitoring.     Return for 7 to 14 days for follow-up on blood pressure with PCP, 3 months for transfer of  care.   Marikay Alar, MD [facility] Primary Care [facility]

## 2023-11-28 NOTE — Assessment & Plan Note (Addendum)
Chronic issue.  Uncontrolled.  Discussed goal blood pressure less than 130/80.  Patient will increase losartan to 100 mg daily.  He will continue HCTZ 25 mg daily and amlodipine 10 mg daily.  Also encouraged healthier diet and increasing activity level.  Follow-up in 7 to 14 days with me.

## 2023-12-15 ENCOUNTER — Telehealth: Payer: Self-pay

## 2023-12-15 ENCOUNTER — Ambulatory Visit (INDEPENDENT_AMBULATORY_CARE_PROVIDER_SITE_OTHER): Payer: 59 | Admitting: Family Medicine

## 2023-12-15 VITALS — BP 130/80 | HR 79 | Temp 98.9°F | Ht 70.0 in | Wt 275.0 lb

## 2023-12-15 DIAGNOSIS — G4733 Obstructive sleep apnea (adult) (pediatric): Secondary | ICD-10-CM

## 2023-12-15 DIAGNOSIS — E669 Obesity, unspecified: Secondary | ICD-10-CM | POA: Diagnosis not present

## 2023-12-15 DIAGNOSIS — I10 Essential (primary) hypertension: Secondary | ICD-10-CM

## 2023-12-15 NOTE — Assessment & Plan Note (Signed)
 Chronic issue.  I encouraged him to use his CPAP nightly for at least 4 hours.  Offered referral to sleep specialist or specialist who does the inspire procedure though patient defers at this time.  He will let us know if he changes his mind.

## 2023-12-15 NOTE — Telephone Encounter (Signed)
 Ok

## 2023-12-15 NOTE — Assessment & Plan Note (Signed)
 Chronic issue.  Encouraged to increase exercise and reduce junk food intake.

## 2023-12-15 NOTE — Progress Notes (Signed)
 Camellia Her, MD Phone: [phone]  Benjamin Summers is a 48 y.o. male who presents today for f/u.  HYPERTENSION Disease Monitoring Home BP Monitoring not checking Chest pain- no    Dyspnea- no Medications Compliance-  taking amlodipine , hydrochlorothiazide , losartan .  Edema- no BMET    Component Value Date/Time   NA 139 08/29/2023 0953   K 3.5 08/29/2023 0953   CL 100 08/29/2023 0953   CO2 29 08/29/2023 0953   GLUCOSE 92 08/29/2023 0953   BUN 11 08/29/2023 0953   CREATININE 1.25 08/29/2023 0953   CALCIUM  9.2 08/29/2023 0953   GFRNONAA 49 (L) 10/19/2018 1905   GFRAA 57 (L) 10/19/2018 1905   OSA: Patient is not using his CPAP very frequently.  He tried to increase use of this recently.  He just cannot keep the mask on when he wears it.  He notes he tried multiple masks.  He is considering the inspire procedure.  Patient has not been exercising as much recently.  Has been eating more junk food than typical.  Social History   Tobacco Use  Smoking Status Never  Smokeless Tobacco Never    Current Outpatient Medications on File Prior to Visit  Medication Sig Dispense Refill   amLODipine  (NORVASC ) 10 MG tablet Take 1 tablet (10 mg total) by mouth daily. 90 tablet 3   hydrochlorothiazide  (HYDRODIURIL ) 25 MG tablet Take 1 tablet (25 mg total) by mouth daily. 90 tablet 3   losartan  (COZAAR ) 100 MG tablet Take 1 tablet (100 mg total) by mouth daily. 90 tablet 1   pravastatin  (PRAVACHOL ) 40 MG tablet Take 1 tablet (40 mg total) by mouth daily. 90 tablet 1   polyethylene glycol-electrolytes (NULYTELY) 420 g solution Prepare according to package instructions. Starting at 5:00 PM: Drink one 8 oz glass of mixture every 15 minutes until you finish half of the jug. Five hours prior to procedure, drink 8 oz glass of mixture every 15 minutes until it is all gone. Make sure you do not drink anything 4 hours prior to your procedure. 4000 mL 0   predniSONE  (DELTASONE ) 20 MG tablet Take 2 tablets  (40 mg total) by mouth daily with breakfast. 10 tablet 0   VYVANSE 50 MG capsule Take 50 mg by mouth daily. (Patient not taking: Reported on 12/15/2023)     No current facility-administered medications on file prior to visit.     ROS see history of present illness  Objective  Physical Exam Vitals:   12/15/23 1356 12/15/23 1415  BP: (!) 140/96 130/80  Pulse: 79   Temp: 98.9 F (37.2 C)   SpO2: 97%     BP Readings from Last 3 Encounters:  12/15/23 130/80  11/28/23 130/84  08/29/23 132/82   Wt Readings from Last 3 Encounters:  12/15/23 275 lb (124.7 kg)  11/28/23 267 lb 9.6 oz (121.4 kg)  08/29/23 250 lb 12.8 oz (113.8 kg)    Physical Exam Constitutional:      General: He is not in acute distress.    Appearance: He is not diaphoretic.  Cardiovascular:     Rate and Rhythm: Normal rate and regular rhythm.     Heart sounds: Normal heart sounds.  Pulmonary:     Effort: Pulmonary effort is normal.     Breath sounds: Normal breath sounds.  Skin:    General: Skin is warm and dry.  Neurological:     Mental Status: He is alert.      Assessment/Plan: Please see individual problem list.  Primary hypertension Assessment & Plan: Chronic issue.  Uncontrolled.  Discussed adding additional medication though the patient is hesitant.  He will continue losartan  100 mg daily, HCTZ 25 mg daily, and amlodipine  10 mg daily.  Encouraged increased exercise and reduce junk food intake.  Discussed his poorly treated sleep apnea may be contributing to his elevated blood pressure.  Discussed getting this under better control as well.  Discussed the risk of stroke, heart attack, kidney issues, and eye issues with poorly treated hypertension.  Orders: -     Basic metabolic panel  Obstructive sleep apnea syndrome Assessment & Plan: Chronic issue.  I encouraged him to use his CPAP nightly for at least 4 hours.  Offered referral to sleep specialist or specialist who does the inspire procedure  though patient defers at this time.  He will let us  know if he changes his mind.   Obesity (BMI 35.0-39.9 without comorbidity) Assessment & Plan: Chronic issue.  Encouraged to increase exercise and reduce junk food intake.     Return in about 3 months (around 03/14/2024) for Transfer of care.   Camellia Her, MD [facility] Primary Care [facility]

## 2023-12-15 NOTE — Patient Instructions (Signed)
 Nice to see you. Please start checking your blood pressure at home.  Your blood pressure still not quite at goal on recheck today.  If you would like to start additional medication or see somebody for the inspire procedure for your sleep apnea please let us  know.  Please try to increase your exercise and reduce your junk food intake.

## 2023-12-15 NOTE — Telephone Encounter (Signed)
 Patient states he would like to see if Dr. Allena Hamilton would accept him as a transfer of care patient from Dr. Camellia Her.  Patient states both of his parents see Dr. Hamilton and he would like to see if she would be willing to be his provider.  Patient states his parents' names are Norleen and [employer].

## 2023-12-15 NOTE — Assessment & Plan Note (Addendum)
 Chronic issue.  Improved on recheck and only slightly above goal.  Encouraged to start checking at home.  Discussed adding additional medication though the patient is hesitant.  He will continue losartan  100 mg daily, HCTZ 25 mg daily, and amlodipine  10 mg daily.  Encouraged increased exercise and reduce junk food intake.  Discussed his poorly treated sleep apnea may be contributing to his elevated blood pressure.  Discussed getting this under better control as well.  Discussed the risk of stroke, heart attack, kidney issues, and eye issues with poorly treated hypertension.

## 2023-12-16 ENCOUNTER — Encounter: Payer: Self-pay | Admitting: Family Medicine

## 2023-12-16 LAB — BASIC METABOLIC PANEL
BUN: 15 mg/dL (ref 6–23)
CO2: 27 meq/L (ref 19–32)
Calcium: 9.2 mg/dL (ref 8.4–10.5)
Chloride: 101 meq/L (ref 96–112)
Creatinine, Ser: 1.25 mg/dL (ref 0.40–1.50)
GFR: 69.14 mL/min (ref >60.00)
Glucose, Bld: 81 mg/dL (ref 70–99)
Potassium: 3.6 meq/L (ref 3.5–5.1)
Sodium: 138 meq/L (ref 135–145)

## 2024-03-09 ENCOUNTER — Ambulatory Visit: Payer: 59 | Admitting: Internal Medicine

## 2024-03-09 ENCOUNTER — Encounter: Payer: Self-pay | Admitting: Internal Medicine

## 2024-03-09 VITALS — BP 126/82 | HR 75 | Temp 98.0°F | Resp 16 | Ht 70.0 in | Wt 270.0 lb

## 2024-03-09 DIAGNOSIS — R7303 Prediabetes: Secondary | ICD-10-CM | POA: Diagnosis not present

## 2024-03-09 DIAGNOSIS — G4733 Obstructive sleep apnea (adult) (pediatric): Secondary | ICD-10-CM | POA: Diagnosis not present

## 2024-03-09 DIAGNOSIS — I10 Essential (primary) hypertension: Secondary | ICD-10-CM | POA: Diagnosis not present

## 2024-03-09 DIAGNOSIS — E785 Hyperlipidemia, unspecified: Secondary | ICD-10-CM | POA: Diagnosis not present

## 2024-03-09 DIAGNOSIS — Z125 Encounter for screening for malignant neoplasm of prostate: Secondary | ICD-10-CM

## 2024-03-09 DIAGNOSIS — Z136 Encounter for screening for cardiovascular disorders: Secondary | ICD-10-CM

## 2024-03-09 DIAGNOSIS — K6289 Other specified diseases of anus and rectum: Secondary | ICD-10-CM

## 2024-03-09 MED ORDER — HYDROCHLOROTHIAZIDE 25 MG PO TABS: 25.0000 mg | ORAL_TABLET | Freq: Every day | ORAL | 1 refills | Status: DC

## 2024-03-09 MED ORDER — AMLODIPINE BESYLATE 10 MG PO TABS: 10.0000 mg | ORAL_TABLET | Freq: Every day | ORAL | 1 refills | Status: DC

## 2024-03-09 MED ORDER — LOSARTAN POTASSIUM 100 MG PO TABS: 100.0000 mg | ORAL_TABLET | Freq: Every day | ORAL | 1 refills | Status: AC

## 2024-03-09 NOTE — Progress Notes (Signed)
 Subjective:    Patient ID: Benjamin Summers, male    DOB: 03-Jan-1976, 48 y.o.   MRN: [id number]  Patient here for  Chief Complaint  Patient presents with   Transitions Of Care    HPI Here to establish care. Previous pt of Dr Birdie Sons. Has a history of hypertension. Taking amlodipine, hydrochlorothiazide and losartan. Blood pressure better today. Reports trying to stay active. In a boot now, bone spur. Is some better with the boot. No chest pain reported. Breathing stable. Has known sleep apnea. Per review, has had issues with cpap. Has had intolerance stating medication. Has tried crestor, pravachol and lipitor. Intolerance to all three. Discussed other treatment options. Has had issues with "anal fissure". 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 Taking stool softener. Regular bowel movements. Better now. Discussed calcium score and CAD risk factor reduction.    Past Medical History:  Diagnosis Date   Depression    Elevated cholesterol    Hypertension    Vaginal infection    Past Surgical History:  Procedure Laterality Date   COLONOSCOPY WITH PROPOFOL N/A 09/20/2021   Procedure: COLONOSCOPY WITH PROPOFOL;  Surgeon: Wyline Mood, MD;  Location: [facility] ENDOSCOPY;  Service: Gastroenterology;  Laterality: N/A;   Family History  Problem Relation Age of Onset   Hypertension Mother    Diabetes Mother    Social History   Socioeconomic History   Marital status: Married    Spouse name: Not on file   Number of children: Not on file   Years of education: Not on file   Highest education level: Not on file  Occupational History   Not on file  Tobacco Use   Smoking status: Never   Smokeless tobacco: Never  Vaping Use   Vaping status: Never Used  Substance and Sexual Activity   Alcohol use: Yes    Alcohol/week: 7.0 standard drinks of alcohol    Types: 7 Standard drinks or equivalent per week   Drug use: No   Sexual activity: Yes    Birth control/protection: Surgical    Comment: Vasectomy  04/27/2020  Other Topics Concern   Not on file  Social History Narrative   Not on file   Social Drivers of Health   Financial Resource Strain: Patient Declined (11/24/2023)   Overall Financial Resource Strain (CARDIA)    Difficulty of Paying Living Expenses: Patient declined  Food Insecurity: Patient Declined (11/24/2023)   Hunger Vital Sign    Worried About Running Out of Food in the Last Year: Patient declined    Ran Out of Food in the Last Year: Patient declined  Transportation Needs: Patient Declined (11/24/2023)   PRAPARE - Administrator, Civil Service (Medical): Patient declined    Lack of Transportation (Non-Medical): Patient declined  Physical Activity: Unknown (11/24/2023)   Exercise Vital Sign    Days of Exercise per Week: 2 days    Minutes of Exercise per Session: Patient declined  Stress: Patient Declined (11/24/2023)   [employer] of Occupational Health - Occupational Stress Questionnaire    Feeling of Stress : Patient declined  Social Connections: Unknown (11/24/2023)   Social Connection and Isolation Panel [NHANES]    Frequency of Communication with Friends and Family: Patient declined    Frequency of Social Gatherings with Friends and Family: Patient declined    Attends Religious Services: Patient declined    Active Member of Clubs or Organizations: Patient declined    Attends Banker Meetings: Not on file  Marital Status: Patient declined     Review of Systems  Constitutional:  Negative for appetite change and unexpected weight change.  HENT:  Negative for congestion and sinus pressure.   Respiratory:  Negative for cough, chest tightness and shortness of breath.   Cardiovascular:  Negative for chest pain, palpitations and leg swelling.  Gastrointestinal:  Negative for abdominal pain, diarrhea, nausea and vomiting.  Genitourinary:  Negative for difficulty urinating and dysuria.  Musculoskeletal:  Negative for joint swelling  and myalgias.       Bone spur as outlined.   Skin:  Negative for color change and rash.  Neurological:  Negative for dizziness and headaches.  Psychiatric/Behavioral:  Negative for agitation and dysphoric mood.        Objective:     BP 126/82   Pulse 75   Temp 98 F (36.7 C)   Resp 16   Ht 5\' 10"  (1.778 m)   Wt 270 lb (122.5 kg)   SpO2 98%   BMI 38.74 kg/m  Wt Readings from Last 3 Encounters:  03/09/24 270 lb (122.5 kg)  12/15/23 275 lb (124.7 kg)  11/28/23 267 lb 9.6 oz (121.4 kg)    Physical Exam Vitals reviewed.  Constitutional:      General: He is not in acute distress.    Appearance: Normal appearance. He is well-developed.  HENT:     Head: Normocephalic and atraumatic.     Right Ear: External ear normal.     Left Ear: External ear normal.     Mouth/Throat:     Pharynx: No oropharyngeal exudate or posterior oropharyngeal erythema.  Eyes:     General: No scleral icterus.       Right eye: No discharge.        Left eye: No discharge.     Conjunctiva/sclera: Conjunctivae normal.  Cardiovascular:     Rate and Rhythm: Normal rate and regular rhythm.  Pulmonary:     Effort: Pulmonary effort is normal. No respiratory distress.     Breath sounds: Normal breath sounds.  Abdominal:     General: Bowel sounds are normal.     Palpations: Abdomen is soft.     Tenderness: There is no abdominal tenderness.  Musculoskeletal:        General: No swelling or tenderness.     Cervical back: Neck supple. No tenderness.  Lymphadenopathy:     Cervical: No cervical adenopathy.  Skin:    Findings: No erythema or rash.  Neurological:     Mental Status: He is alert.  Psychiatric:        Mood and Affect: Mood normal.        Behavior: Behavior normal.         Outpatient Encounter Medications as of 03/09/2024  Medication Sig   Coenzyme Q10 (COQ10 PO) Take 1 capsule by mouth daily.   Omega-3 Fatty Acids (FISH OIL PO) Take 1 capsule by mouth daily.   amLODipine (NORVASC) 10  MG tablet Take 1 tablet (10 mg total) by mouth daily.   hydrochlorothiazide (HYDRODIURIL) 25 MG tablet Take 1 tablet (25 mg total) by mouth daily.   losartan (COZAAR) 100 MG tablet Take 1 tablet (100 mg total) by mouth daily.   [DISCONTINUED] amLODipine (NORVASC) 10 MG tablet Take 1 tablet (10 mg total) by mouth daily.   [DISCONTINUED] hydrochlorothiazide (HYDRODIURIL) 25 MG tablet Take 1 tablet (25 mg total) by mouth daily.   [DISCONTINUED] losartan (COZAAR) 100 MG tablet Take 1 tablet (100 mg total)  by mouth daily.   [DISCONTINUED] pravastatin (PRAVACHOL) 40 MG tablet Take 1 tablet (40 mg total) by mouth daily.   No facility-administered encounter medications on file as of 03/09/2024.     Lab Results  Component Value Date   WBC 8.0 10/19/2018   HGB 16.6 10/19/2018   HCT 50.8 10/19/2018   PLT 218 10/19/2018   GLUCOSE 81 12/15/2023   CHOL 157 02/26/2023   TRIG 96.0 02/26/2023   HDL 46.30 02/26/2023   LDLDIRECT 110.0 10/29/2021   LDLCALC 92 02/26/2023   ALT 14 02/26/2023   AST 13 02/26/2023   NA 138 12/15/2023   K 3.6 12/15/2023   CL 101 12/15/2023   CREATININE 1.25 12/15/2023   BUN 15 12/15/2023   CO2 27 12/15/2023   TSH 1.64 12/31/2017   PSA 0.52 02/26/2023   HGBA1C 6.1 02/26/2023       Assessment & Plan:  Hyperlipidemia, unspecified hyperlipidemia type Assessment & Plan: Intolerant to crestor, lipitor and pravastatin. Not on cholesterol medication now. Discussed treatment options. Follow lipid panel.   Orders: -     CBC with Differential/Platelet; Future -     Hepatic function panel; Future -     Lipid panel; Future -     TSH; Future  Primary hypertension Assessment & Plan: Blood pressure doing well today. Continue amlodipine, hydrochlorothiazide and losartan. Follow pressures. Follow metabolic panel. No changes today.   Orders: -     Basic metabolic panel with GFR; Future -     amLODIPine Besylate; Take 1 tablet (10 mg total) by mouth daily.  Dispense: 90  tablet; Refill: 1 -     hydroCHLOROthiazide; Take 1 tablet (25 mg total) by mouth daily.  Dispense: 90 tablet; Refill: 1 -     Losartan Potassium; Take 1 tablet (100 mg total) by mouth daily.  Dispense: 90 tablet; Refill: 1  Prediabetes Assessment & Plan: Low carb diet and exercise. Follow met b and A1c.   Orders: -     Hemoglobin A1c; Future  Obstructive sleep apnea syndrome Assessment & Plan: This has been a chronic issue. Per review, has had issues with cpap. Need to treat. Discussed risk of untreated sleep apnea.    Prostate cancer screening -     PSA; Future  Encounter for screening for coronary artery disease Assessment & Plan: Discussed intolerance to cholesterol medication. No change in blood pressure medication - blood pressure looks good on today's checks. Discussed CT calcium score and risk factor modification.   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 Discussed. Declined exam today. Using stool softener. Feels better. Follow.       Dale [place], MD

## 2024-03-14 ENCOUNTER — Encounter: Payer: Self-pay | Admitting: Internal Medicine

## 2024-03-14 DIAGNOSIS — Z136 Encounter for screening for cardiovascular disorders: Secondary | ICD-10-CM | POA: Insufficient documentation

## 2024-03-14 NOTE — Assessment & Plan Note (Signed)
 Low-carb diet and exercise.  Follow met b and A1c.

## 2024-03-14 NOTE — Assessment & Plan Note (Signed)
 Discussed intolerance to cholesterol medication. No change in blood pressure medication - blood pressure looks good on today's checks. Discussed CT calcium score and risk factor modification.

## 2024-03-14 NOTE — Assessment & Plan Note (Signed)
 Blood pressure doing well today. Continue amlodipine, hydrochlorothiazide and losartan. Follow pressures. Follow metabolic panel. No changes today.

## 2024-03-14 NOTE — Assessment & Plan Note (Signed)
 Intolerant to crestor, lipitor and pravastatin. Not on cholesterol medication now. Discussed treatment options. Follow lipid panel.

## 2024-03-14 NOTE — Assessment & Plan Note (Signed)
 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 Discussed. Declined exam today. Using stool softener. Feels better. Follow.

## 2024-03-14 NOTE — Assessment & Plan Note (Signed)
 This has been a chronic issue. Per review, has had issues with cpap. Need to treat. Discussed risk of untreated sleep apnea.

## 2024-03-19 ENCOUNTER — Other Ambulatory Visit

## 2024-03-19 DIAGNOSIS — Z125 Encounter for screening for malignant neoplasm of prostate: Secondary | ICD-10-CM | POA: Diagnosis not present

## 2024-03-19 DIAGNOSIS — R7303 Prediabetes: Secondary | ICD-10-CM | POA: Diagnosis not present

## 2024-03-19 DIAGNOSIS — E785 Hyperlipidemia, unspecified: Secondary | ICD-10-CM | POA: Diagnosis not present

## 2024-03-19 DIAGNOSIS — I10 Essential (primary) hypertension: Secondary | ICD-10-CM

## 2024-03-19 LAB — BASIC METABOLIC PANEL WITH GFR
BUN: 15 mg/dL (ref 6–23)
CO2: 30 meq/L (ref 19–32)
Calcium: 8.8 mg/dL (ref 8.4–10.5)
Chloride: 102 meq/L (ref 96–112)
Creatinine, Ser: 1.22 mg/dL (ref 0.40–1.50)
GFR: 71.05 mL/min (ref >60.00)
Glucose, Bld: 100 mg/dL — ABNORMAL HIGH (ref 70–99)
Potassium: 3.8 meq/L (ref 3.5–5.1)
Sodium: 140 meq/L (ref 135–145)

## 2024-03-19 LAB — HEPATIC FUNCTION PANEL
ALT: 11 U/L (ref 0–53)
AST: 11 U/L (ref 0–37)
Albumin: 4.5 g/dL (ref 3.5–5.2)
Alkaline Phosphatase: 39 U/L (ref 39–117)
Bilirubin, Direct: 0.1 mg/dL (ref 0.0–0.3)
Total Bilirubin: 0.8 mg/dL (ref 0.2–1.2)
Total Protein: 6.9 g/dL (ref 6.0–8.3)

## 2024-03-19 LAB — CBC WITH DIFFERENTIAL/PLATELET
Basophils Absolute: 0 10*3/uL (ref 0.0–0.1)
Basophils Relative: 0.5 % (ref 0.0–3.0)
Eosinophils Absolute: 0.1 10*3/uL (ref 0.0–0.7)
Eosinophils Relative: 2.6 % (ref 0.0–5.0)
HCT: 44.1 % (ref 39.0–52.0)
Hemoglobin: 14.5 g/dL (ref 13.0–17.0)
Lymphocytes Relative: 50.1 % — ABNORMAL HIGH (ref 12.0–46.0)
Lymphs Abs: 2.8 10*3/uL (ref 0.7–4.0)
MCHC: 32.8 g/dL (ref 30.0–36.0)
MCV: 84.2 fl (ref 78.0–100.0)
Monocytes Absolute: 0.4 10*3/uL (ref 0.1–1.0)
Monocytes Relative: 6.6 % (ref 3.0–12.0)
Neutro Abs: 2.2 10*3/uL (ref 1.4–7.7)
Neutrophils Relative %: 40.2 % — ABNORMAL LOW (ref 43.0–77.0)
Platelets: 192 10*3/uL (ref 150.0–400.0)
RBC: 5.24 Mil/uL (ref 4.22–5.81)
RDW: 15.9 % — ABNORMAL HIGH (ref 11.5–15.5)
WBC: 5.6 10*3/uL (ref 4.0–10.5)

## 2024-03-19 LAB — LIPID PANEL
Cholesterol: 274 mg/dL — ABNORMAL HIGH (ref 0–200)
HDL: 51.2 mg/dL (ref >39.00)
LDL Cholesterol: 195 mg/dL — ABNORMAL HIGH (ref 0–99)
NonHDL: 222.34
Total CHOL/HDL Ratio: 5
Triglycerides: 137 mg/dL (ref 0.0–149.0)
VLDL: 27.4 mg/dL (ref 0.0–40.0)

## 2024-03-19 LAB — TSH: TSH: 0.74 u[IU]/mL (ref 0.35–5.50)

## 2024-03-19 LAB — PSA: PSA: 0.52 ng/mL (ref 0.10–4.00)

## 2024-03-19 LAB — HEMOGLOBIN A1C: Hgb A1c MFr Bld: 6.1 % (ref 4.6–6.5)

## 2024-03-22 ENCOUNTER — Ambulatory Visit
Admission: RE | Admit: 2024-03-22 | Discharge: 2024-03-22 | Disposition: A | Discharge status: Home / Self Care | Payer: Self-pay | Source: Ambulatory Visit | Attending: Internal Medicine | Admitting: Internal Medicine

## 2024-03-22 DIAGNOSIS — Z136 Encounter for screening for cardiovascular disorders: Secondary | ICD-10-CM | POA: Insufficient documentation

## 2024-03-23 ENCOUNTER — Telehealth: Payer: Self-pay

## 2024-03-23 NOTE — Telephone Encounter (Signed)
 Copied from CRM [id number]. Topic: Clinical - Lab/Test Results >> Mar 23, 2024  9:40 AM Ovid Blow wrote: Reason for CRM: Patient is aware of CT results

## 2024-03-29 ENCOUNTER — Other Ambulatory Visit (HOSPITAL_COMMUNITY): Payer: Self-pay

## 2024-03-29 ENCOUNTER — Encounter: Payer: Self-pay | Admitting: Internal Medicine

## 2024-03-29 ENCOUNTER — Telehealth: Admitting: Internal Medicine

## 2024-03-29 ENCOUNTER — Telehealth: Payer: Self-pay

## 2024-03-29 DIAGNOSIS — T466X5A Adverse effect of antihyperlipidemic and antiarteriosclerotic drugs, initial encounter: Secondary | ICD-10-CM | POA: Diagnosis not present

## 2024-03-29 DIAGNOSIS — E785 Hyperlipidemia, unspecified: Secondary | ICD-10-CM | POA: Diagnosis not present

## 2024-03-29 DIAGNOSIS — I10 Essential (primary) hypertension: Secondary | ICD-10-CM

## 2024-03-29 DIAGNOSIS — G720 Drug-induced myopathy: Secondary | ICD-10-CM | POA: Insufficient documentation

## 2024-03-29 MED ORDER — REPATHA SURECLICK 140 MG/ML ~~LOC~~ SOAJ: 140.0000 mg | SUBCUTANEOUS | 2 refills | Status: DC

## 2024-03-29 NOTE — Assessment & Plan Note (Signed)
 Intolerant to crestor , lipitor and pravastatin . Trial of repatha  as outlined.

## 2024-03-29 NOTE — Telephone Encounter (Signed)
 Pharmacy Patient Advocate Encounter   Received notification from CoverMyMeds that prior authorization for REPATHA  SURCLICK is required/requested.   Insurance verification completed.   The patient is insured through [facility] .   Per test claim: PA required; PA submitted to above mentioned insurance via CoverMyMeds Key/confirmation #/EOC [id number] Status is pending

## 2024-03-29 NOTE — Assessment & Plan Note (Signed)
 Continue amlodipine , hydrochlorothiazide  and losartan . Follow pressures.  No changes today.

## 2024-03-29 NOTE — Assessment & Plan Note (Signed)
 Intolerant to crestor , lipitor and pravastatin . Not on cholesterol medication now. Discussed treatment options, given LDL 190s. Discussed trial of another statin vs zetia vs repatha . Given has tried three statins and given level of LDL, agreed to start repatha .

## 2024-03-29 NOTE — Telephone Encounter (Signed)
 Pharmacy Patient Advocate Encounter  Received notification from [facility] that Prior Authorization for REPATHAT SURECLICK has been APPROVED from 03/29/2024 to 03/29/2025. Ran test claim, Copay is $47.00. This test claim was processed through [facility]- copay amounts may vary at other pharmacies due to pharmacy/plan contracts, or as the patient moves through the different stages of their insurance plan.   PA #/Case ID/Reference #: [id number]

## 2024-03-29 NOTE — Progress Notes (Signed)
 Patient ID: Benjamin Summers, male   DOB: 03-Jan-1976, 48 y.o.   MRN: [id number]   Virtual Visit via video Note  I connected with Hyland Younge by a video enabled telemedicine application and verified that I am speaking with the correct person using two identifiers. Location patient: home Location provider: work Persons participating in the virtual visit: patient, provider  The limitations, risks, security and privacy concerns of performing an evaluation and management service by video and the availability of in person appointments have been discussed. It has also been discussed with the patient that there may be a patient responsible charge related to this service. The patient expressed understanding and agreed to proceed.   Reason for visit: work in appt  HPI: Here to discuss cholesterol treatment. Has had intolerance to statin medication. Has tried crestor , pravachol  and lipitor.  Recent CT calcium  score 0. Recent cholesterol revealed LDL 195. Discussed recent test results. Given level of LDL, discussed treatment for cholesterol. Discussed trial of another statin medication. Discussed zetia and discussed repatha /praluent. Still having issues with his foot. Wearing a boot. Had questions regarding possible referral for injection.    ROS: See pertinent positives and negatives per HPI.  Past Medical History:  Diagnosis Date   Depression    Elevated cholesterol    Hypertension    Vaginal infection     Past Surgical History:  Procedure Laterality Date   COLONOSCOPY WITH PROPOFOL  N/A 09/20/2021   Procedure: COLONOSCOPY WITH PROPOFOL ;  Surgeon: Luke Salaam, MD;  Location: [facility] ENDOSCOPY;  Service: Gastroenterology;  Laterality: N/A;    Family History  Problem Relation Age of Onset   Hypertension Mother    Diabetes Mother     SOCIAL HX: reviewed.    Current Outpatient Medications:    Evolocumab  (REPATHA  SURECLICK) 140 MG/ML SOAJ, Inject 140 mg into the skin every 14 (fourteen) days.,  Disp: 2 mL, Rfl: 2   amLODipine  (NORVASC ) 10 MG tablet, Take 1 tablet (10 mg total) by mouth daily., Disp: 90 tablet, Rfl: 1   Coenzyme Q10 (COQ10 PO), Take 1 capsule by mouth daily., Disp: , Rfl:    hydrochlorothiazide  (HYDRODIURIL ) 25 MG tablet, Take 1 tablet (25 mg total) by mouth daily., Disp: 90 tablet, Rfl: 1   losartan  (COZAAR ) 100 MG tablet, Take 1 tablet (100 mg total) by mouth daily., Disp: 90 tablet, Rfl: 1   Omega-3 Fatty Acids (FISH OIL PO), Take 1 capsule by mouth daily., Disp: , Rfl:   EXAM:  GENERAL: alert, oriented, appears well and in no acute distress  HEENT: atraumatic, conjunttiva clear, no obvious abnormalities on inspection of external nose and ears  NECK: normal movements of the head and neck  LUNGS: on inspection no signs of respiratory distress, breathing rate appears normal, no obvious gross SOB, gasping or wheezing  CV: no obvious cyanosis  PSYCH/NEURO: pleasant and cooperative, no obvious depression or anxiety, speech and thought processing grossly intact  ASSESSMENT AND PLAN:  Discussed the following assessment and plan:  Problem List Items Addressed This Visit     Hyperlipidemia (Chronic)   Intolerant to crestor , lipitor and pravastatin . Not on cholesterol medication now. Discussed treatment options, given LDL 190s. Discussed trial of another statin vs zetia vs repatha . Given has tried three statins and given level of LDL, agreed to start repatha .        Relevant Medications   Evolocumab  (REPATHA  SURECLICK) 140 MG/ML SOAJ   Hypertension (Chronic)   Continue amlodipine , hydrochlorothiazide  and losartan . Follow pressures.  No changes today.  Relevant Medications   Evolocumab  (REPATHA  SURECLICK) 140 MG/ML SOAJ   Statin myopathy - Primary   Intolerant to crestor , lipitor and pravastatin . Trial of repatha  as outlined.       Relevant Medications   Evolocumab  (REPATHA  SURECLICK) 140 MG/ML SOAJ    Return if symptoms worsen or fail to  improve.   I discussed the assessment and treatment plan with the patient. The patient was provided an opportunity to ask questions and all were answered. The patient agreed with the plan and demonstrated an understanding of the instructions.   The patient was advised to call back or seek an in-person evaluation if the symptoms worsen or if the condition fails to improve as anticipated.    Dellar Fenton, MD

## 2024-03-30 ENCOUNTER — Other Ambulatory Visit (HOSPITAL_COMMUNITY): Payer: Self-pay

## 2024-05-19 ENCOUNTER — Encounter: Payer: Self-pay | Admitting: Internal Medicine

## 2024-05-19 DIAGNOSIS — M79672 Pain in left foot: Secondary | ICD-10-CM

## 2024-05-20 NOTE — Telephone Encounter (Signed)
 With persistent problems, bone spur - heel can refer to podiatry for further evaluation. If agreeable, confirm if he has a preference of which podiatrist he prefers to see.

## 2024-05-25 NOTE — Telephone Encounter (Signed)
Order placed for podiatry referral.   

## 2024-06-04 ENCOUNTER — Ambulatory Visit (INDEPENDENT_AMBULATORY_CARE_PROVIDER_SITE_OTHER)

## 2024-06-04 ENCOUNTER — Ambulatory Visit: Admitting: Podiatry

## 2024-06-04 VITALS — Ht 70.0 in | Wt 270.0 lb

## 2024-06-04 DIAGNOSIS — M7732 Calcaneal spur, left foot: Secondary | ICD-10-CM

## 2024-06-04 DIAGNOSIS — M7752 Other enthesopathy of left foot: Secondary | ICD-10-CM | POA: Diagnosis not present

## 2024-06-04 NOTE — Progress Notes (Signed)
   Chief Complaint  Patient presents with   Foot Pain    Pt is here due to left foot pain states he has a bone spur in foot was dx with this in October, he states the pain comes and goes wants some recommendations on what he should do for the pain.    HPI: 48 y.o. male presenting today as a new patient for evaluation of right posterior heel pain.  Onset October 2024 while he was on a cruise.  Initially seen at [facility]  The pain has improved significantly however he continues to have some mild intermittent tenderness to the posterior heel  Past Medical History:  Diagnosis Date   Depression    Elevated cholesterol    Hypertension    Vaginal infection     Past Surgical History:  Procedure Laterality Date   COLONOSCOPY WITH PROPOFOL  N/A 09/20/2021   Procedure: COLONOSCOPY WITH PROPOFOL ;  Surgeon: Therisa Bi, MD;  Location: [facility] ENDOSCOPY;  Service: Gastroenterology;  Laterality: N/A;    No Known Allergies   Physical Exam: General: The patient is alert and oriented x3 in no acute distress.  Dermatology: Skin is warm, dry and supple bilateral lower extremities.   Vascular: Palpable pedal pulses bilaterally. Capillary refill within normal limits.  No appreciable edema.  No erythema.  Neurological: Grossly intact via light touch  Musculoskeletal Exam: No pedal deformities noted.  Minimal tenderness with palpation of the posterior heel  Radiographic Exam RT foot 06/04/2024:  Normal osseous mineralization. Joint spaces preserved.  No fractures or osseous irregularities noted.  Posterior heel spur noted on lateral view  Assessment/Plan of Care: 1.  Posterior heel spur left 2.  Mild intermittent insertional Achilles tendinitis left  -Patient evaluated.  X-rays reviewed -Today we discussed conservative treatment modalities including daily stretching exercises as well as appropriate shoes that do not irritate or constrict the posterior heel -Recommend OTC Motrin PRN -Return to  clinic as needed  *works IT       Thresa EMERSON Sar, DPM [facility]  Dr. Thresa EMERSON Sar, DPM    [address] [place], [place] [address]                Office [phone]  Fax [phone]

## 2024-06-08 ENCOUNTER — Encounter: Payer: Self-pay | Admitting: Internal Medicine

## 2024-06-08 ENCOUNTER — Ambulatory Visit (INDEPENDENT_AMBULATORY_CARE_PROVIDER_SITE_OTHER): Admitting: Internal Medicine

## 2024-06-08 VITALS — BP 130/86 | HR 86 | Temp 98.2°F | Ht 70.0 in | Wt 271.6 lb

## 2024-06-08 DIAGNOSIS — G720 Drug-induced myopathy: Secondary | ICD-10-CM | POA: Diagnosis not present

## 2024-06-08 DIAGNOSIS — M7730 Calcaneal spur, unspecified foot: Secondary | ICD-10-CM

## 2024-06-08 DIAGNOSIS — E785 Hyperlipidemia, unspecified: Secondary | ICD-10-CM

## 2024-06-08 DIAGNOSIS — I10 Essential (primary) hypertension: Secondary | ICD-10-CM | POA: Diagnosis not present

## 2024-06-08 DIAGNOSIS — T466X5A Adverse effect of antihyperlipidemic and antiarteriosclerotic drugs, initial encounter: Secondary | ICD-10-CM

## 2024-06-08 NOTE — Progress Notes (Signed)
 Subjective:    Patient ID: Benjamin Summers, male    DOB: Jun 07, 1976, 48 y.o.   MRN: [id number]  Patient here for  Chief Complaint  Patient presents with   Medical Management of Chronic Issues    HPI Here for a scheduled follow up - follow up regarding hypercholesterolemia and hypertension. Started on repatha . Tolerating. Recently evaluated for heel spur. Supports. Overall stable. No chest pain or sob reported. No abdominal pain or bowel change reported. Fissure - healed.    Past Medical History:  Diagnosis Date   Depression    Elevated cholesterol    Hypertension    Vaginal infection    Past Surgical History:  Procedure Laterality Date   COLONOSCOPY WITH PROPOFOL  N/A 09/20/2021   Procedure: COLONOSCOPY WITH PROPOFOL ;  Surgeon: Therisa Bi, MD;  Location: [facility] ENDOSCOPY;  Service: Gastroenterology;  Laterality: N/A;   Family History  Problem Relation Age of Onset   Hypertension Mother    Diabetes Mother    Social History   Socioeconomic History   Marital status: Married    Spouse name: Not on file   Number of children: Not on file   Years of education: Not on file   Highest education level: Not on file  Occupational History   Not on file  Tobacco Use   Smoking status: Never   Smokeless tobacco: Never  Vaping Use   Vaping status: Never Used  Substance and Sexual Activity   Alcohol use: Yes    Alcohol/week: 7.0 standard drinks of alcohol    Types: 7 Standard drinks or equivalent per week   Drug use: No   Sexual activity: Yes    Birth control/protection: Surgical    Comment: Vasectomy 04/27/2020  Other Topics Concern   Not on file  Social History Narrative   Not on file   Social Drivers of Health   Financial Resource Strain: Patient Declined (06/04/2024)   Overall Financial Resource Strain (CARDIA)    Difficulty of Paying Living Expenses: Patient declined  Food Insecurity: Patient Declined (06/04/2024)   Hunger Vital Sign    Worried About Running Out of Food in  the Last Year: Patient declined    Ran Out of Food in the Last Year: Patient declined  Transportation Needs: Patient Declined (06/04/2024)   PRAPARE - Administrator, Civil Service (Medical): Patient declined    Lack of Transportation (Non-Medical): Patient declined  Physical Activity: Unknown (06/04/2024)   Exercise Vital Sign    Days of Exercise per Week: Patient declined    Minutes of Exercise per Session: Not on file  Stress: Patient Declined (06/04/2024)   [employer] of Occupational Health - Occupational Stress Questionnaire    Feeling of Stress: Patient declined  Social Connections: Unknown (06/04/2024)   Social Connection and Isolation Panel    Frequency of Communication with Friends and Family: Patient declined    Frequency of Social Gatherings with Friends and Family: Patient declined    Attends Religious Services: Patient declined    Database administrator or Organizations: Patient declined    Attends Engineer, structural: Not on file    Marital Status: Patient declined     Review of Systems  Constitutional:  Negative for appetite change and unexpected weight change.  HENT:  Negative for congestion and sinus pressure.   Respiratory:  Negative for cough, chest tightness and shortness of breath.   Cardiovascular:  Negative for chest pain, palpitations and leg swelling.  Gastrointestinal:  Negative for abdominal  pain, diarrhea, nausea and vomiting.  Genitourinary:  Negative for difficulty urinating and dysuria.  Musculoskeletal:  Negative for joint swelling and myalgias.  Skin:  Negative for color change and rash.  Neurological:  Negative for dizziness and headaches.  Psychiatric/Behavioral:  Negative for agitation and dysphoric mood.        Objective:     BP 130/86   Pulse 86   Temp 98.2 F (36.8 C) (Oral)   Ht 5' 10 (1.778 m)   Wt 271 lb 9.6 oz (123.2 kg)   SpO2 97%   BMI 38.97 kg/m  Wt Readings from Last 3 Encounters:  06/08/24 271  lb 9.6 oz (123.2 kg)  06/04/24 270 lb (122.5 kg)  03/09/24 270 lb (122.5 kg)    Physical Exam Vitals reviewed.  Constitutional:      General: He is not in acute distress.    Appearance: Normal appearance. He is well-developed.  HENT:     Head: Normocephalic and atraumatic.     Right Ear: External ear normal.     Left Ear: External ear normal.     Mouth/Throat:     Pharynx: No oropharyngeal exudate or posterior oropharyngeal erythema.  Eyes:     General: No scleral icterus.       Right eye: No discharge.        Left eye: No discharge.     Conjunctiva/sclera: Conjunctivae normal.  Cardiovascular:     Rate and Rhythm: Normal rate and regular rhythm.  Pulmonary:     Effort: Pulmonary effort is normal. No respiratory distress.     Breath sounds: Normal breath sounds.  Abdominal:     General: Bowel sounds are normal.     Palpations: Abdomen is soft.     Tenderness: There is no abdominal tenderness.  Musculoskeletal:        General: No swelling or tenderness.     Cervical back: Neck supple. No tenderness.  Lymphadenopathy:     Cervical: No cervical adenopathy.  Skin:    Findings: No erythema or rash.  Neurological:     Mental Status: He is alert.  Psychiatric:        Mood and Affect: Mood normal.        Behavior: Behavior normal.         Outpatient Encounter Medications as of 06/08/2024  Medication Sig   amLODipine  (NORVASC ) 10 MG tablet Take 1 tablet (10 mg total) by mouth daily.   Coenzyme Q10 (COQ10 PO) Take 1 capsule by mouth daily.   Evolocumab  (REPATHA  SURECLICK) 140 MG/ML SOAJ Inject 140 mg into the skin every 14 (fourteen) days.   hydrochlorothiazide  (HYDRODIURIL ) 25 MG tablet Take 1 tablet (25 mg total) by mouth daily.   losartan  (COZAAR ) 100 MG tablet Take 1 tablet (100 mg total) by mouth daily.   Omega-3 Fatty Acids (FISH OIL PO) Take 1 capsule by mouth daily.   No facility-administered encounter medications on file as of 06/08/2024.     Lab Results   Component Value Date   WBC 5.6 03/19/2024   HGB 14.5 03/19/2024   HCT 44.1 03/19/2024   PLT 192.0 03/19/2024   GLUCOSE 100 (H) 03/19/2024   CHOL 274 (H) 03/19/2024   TRIG 137.0 03/19/2024   HDL 51.20 03/19/2024   LDLDIRECT 110.0 10/29/2021   LDLCALC 195 (H) 03/19/2024   ALT 11 03/19/2024   AST 11 03/19/2024   NA 140 03/19/2024   K 3.8 03/19/2024   CL 102 03/19/2024   CREATININE 1.22 03/19/2024  BUN 15 03/19/2024   CO2 30 03/19/2024   TSH 0.74 03/19/2024   PSA 0.52 03/19/2024   HGBA1C 6.1 03/19/2024    CT CARDIAC SCORING (SELF PAY ONLY) Result Date: 03/22/2024 CLINICAL DATA:  Risk stratification EXAM: Coronary Calcium  Score TECHNIQUE: The patient was scanned on a Siemens Somatom scanner. Axial non-contrast 3 mm slices were carried out through the heart. The data set was analyzed on a dedicated work station and scored using the Agatson method. FINDINGS: Non-cardiac: See separate report from [facility]. Ascending Aorta: Normal size Pericardium: Normal Coronary arteries: Normal origin of left and right coronary arteries. Distribution of arterial calcifications if present, as noted below; LM 0 LAD 0 LCx 0 RCA 0 Total 0 IMPRESSION AND RECOMMENDATION: 1. Coronary calcium  score of 0. 2. CAC 0, CAC-DRS A0. 3. Continue heart healthy lifestyle and risk factor modification. Electronically Signed   By: Redell Cave M.D.   On: 03/22/2024 13:16       Assessment & Plan:  Hyperlipidemia, unspecified hyperlipidemia type Assessment & Plan: Intolerant to crestor , lipitor and pravastatin . On repatha  now. Low cholesterol diet and exercise. Follow lipid panel.      Primary hypertension Assessment & Plan: Continue amlodipine , hydrochlorothiazide  and losartan . Follow pressures.  No changes today.    Statin myopathy Assessment & Plan: Intolerant to crestor , lipitor and pravastatin . On repatha .    Calcaneal spur, unspecified laterality Assessment & Plan: Saw podiatry. Supports.        Allena Hamilton, MD

## 2024-06-13 ENCOUNTER — Encounter: Payer: Self-pay | Admitting: Internal Medicine

## 2024-06-13 DIAGNOSIS — M7730 Calcaneal spur, unspecified foot: Secondary | ICD-10-CM | POA: Insufficient documentation

## 2024-06-13 NOTE — Assessment & Plan Note (Signed)
 Intolerant to crestor , lipitor and pravastatin . On repatha  now. Low cholesterol diet and exercise. Follow lipid panel.

## 2024-06-13 NOTE — Assessment & Plan Note (Signed)
 Continue amlodipine , hydrochlorothiazide  and losartan . Follow pressures.  No changes today.

## 2024-06-13 NOTE — Assessment & Plan Note (Signed)
 Saw podiatry. Supports.

## 2024-06-13 NOTE — Assessment & Plan Note (Signed)
 Intolerant to crestor , lipitor and pravastatin . On repatha .

## 2024-06-19 ENCOUNTER — Other Ambulatory Visit: Payer: Self-pay | Admitting: Internal Medicine

## 2024-06-19 DIAGNOSIS — E785 Hyperlipidemia, unspecified: Secondary | ICD-10-CM

## 2024-06-19 DIAGNOSIS — G720 Drug-induced myopathy: Secondary | ICD-10-CM

## 2024-08-25 ENCOUNTER — Telehealth: Payer: Self-pay | Admitting: Internal Medicine

## 2024-08-25 DIAGNOSIS — I10 Essential (primary) hypertension: Secondary | ICD-10-CM

## 2024-08-25 DIAGNOSIS — R7303 Prediabetes: Secondary | ICD-10-CM

## 2024-08-25 DIAGNOSIS — E785 Hyperlipidemia, unspecified: Secondary | ICD-10-CM

## 2024-08-25 NOTE — Telephone Encounter (Signed)
 Lab order needed

## 2024-08-25 NOTE — Addendum Note (Signed)
 Addended by: GLENDIA ALLENA RAMAN on: 08/25/2024 01:40 PM   Modules accepted: Orders

## 2024-08-25 NOTE — Telephone Encounter (Signed)
 Orders placed for future labs.

## 2024-09-07 ENCOUNTER — Ambulatory Visit: Payer: Self-pay | Admitting: Internal Medicine

## 2024-09-07 ENCOUNTER — Other Ambulatory Visit (INDEPENDENT_AMBULATORY_CARE_PROVIDER_SITE_OTHER)

## 2024-09-07 DIAGNOSIS — E785 Hyperlipidemia, unspecified: Secondary | ICD-10-CM | POA: Diagnosis not present

## 2024-09-07 DIAGNOSIS — I10 Essential (primary) hypertension: Secondary | ICD-10-CM | POA: Diagnosis not present

## 2024-09-07 DIAGNOSIS — R7303 Prediabetes: Secondary | ICD-10-CM | POA: Diagnosis not present

## 2024-09-07 LAB — CBC WITH DIFFERENTIAL/PLATELET
Basophils Absolute: 0.1 K/uL (ref 0.0–0.1)
Basophils Relative: 0.8 % (ref 0.0–3.0)
Eosinophils Absolute: 0.2 K/uL (ref 0.0–0.7)
Eosinophils Relative: 3 % (ref 0.0–5.0)
HCT: 43.8 % (ref 39.0–52.0)
Hemoglobin: 14.4 g/dL (ref 13.0–17.0)
Lymphocytes Relative: 48.3 % — ABNORMAL HIGH (ref 12.0–46.0)
Lymphs Abs: 3.4 K/uL (ref 0.7–4.0)
MCHC: 32.9 g/dL (ref 30.0–36.0)
MCV: 83 fl (ref 78.0–100.0)
Monocytes Absolute: 0.4 K/uL (ref 0.1–1.0)
Monocytes Relative: 5.7 % (ref 3.0–12.0)
Neutro Abs: 2.9 K/uL (ref 1.4–7.7)
Neutrophils Relative %: 42.2 % — ABNORMAL LOW (ref 43.0–77.0)
Platelets: 279 K/uL (ref 150.0–400.0)
RBC: 5.28 Mil/uL (ref 4.22–5.81)
RDW: 15.5 % (ref 11.5–15.5)
WBC: 6.9 K/uL (ref 4.0–10.5)

## 2024-09-07 LAB — BASIC METABOLIC PANEL WITH GFR
BUN: 16 mg/dL (ref 6–23)
CO2: 30 meq/L (ref 19–32)
Calcium: 9.3 mg/dL (ref 8.4–10.5)
Chloride: 102 meq/L (ref 96–112)
Creatinine, Ser: 1.32 mg/dL (ref 0.40–1.50)
GFR: 64.43 mL/min (ref >60.00)
Glucose, Bld: 98 mg/dL (ref 70–99)
Potassium: 3.4 meq/L — ABNORMAL LOW (ref 3.5–5.1)
Sodium: 140 meq/L (ref 135–145)

## 2024-09-07 LAB — LIPID PANEL
Cholesterol: 157 mg/dL (ref 0–200)
HDL: 49.4 mg/dL (ref >39.00)
LDL Cholesterol: 88 mg/dL (ref 0–99)
NonHDL: 107.96
Total CHOL/HDL Ratio: 3
Triglycerides: 100 mg/dL (ref 0.0–149.0)
VLDL: 20 mg/dL (ref 0.0–40.0)

## 2024-09-07 LAB — HEMOGLOBIN A1C: Hgb A1c MFr Bld: 6.2 % (ref 4.6–6.5)

## 2024-09-07 LAB — HEPATIC FUNCTION PANEL
ALT: 17 U/L (ref 0–53)
AST: 14 U/L (ref 0–37)
Albumin: 4.4 g/dL (ref 3.5–5.2)
Alkaline Phosphatase: 42 U/L (ref 39–117)
Bilirubin, Direct: 0.1 mg/dL (ref 0.0–0.3)
Total Bilirubin: 0.5 mg/dL (ref 0.2–1.2)
Total Protein: 7.4 g/dL (ref 6.0–8.3)

## 2024-09-10 ENCOUNTER — Encounter: Payer: Self-pay | Admitting: Internal Medicine

## 2024-09-10 ENCOUNTER — Ambulatory Visit: Admitting: Internal Medicine

## 2024-09-10 VITALS — BP 118/64 | HR 88 | Temp 98.0°F | Ht 70.0 in | Wt 273.0 lb

## 2024-09-10 DIAGNOSIS — I10 Essential (primary) hypertension: Secondary | ICD-10-CM | POA: Diagnosis not present

## 2024-09-10 DIAGNOSIS — T466X5A Adverse effect of antihyperlipidemic and antiarteriosclerotic drugs, initial encounter: Secondary | ICD-10-CM

## 2024-09-10 DIAGNOSIS — E876 Hypokalemia: Secondary | ICD-10-CM | POA: Diagnosis not present

## 2024-09-10 DIAGNOSIS — G720 Drug-induced myopathy: Secondary | ICD-10-CM

## 2024-09-10 DIAGNOSIS — E785 Hyperlipidemia, unspecified: Secondary | ICD-10-CM

## 2024-09-10 DIAGNOSIS — F419 Anxiety disorder, unspecified: Secondary | ICD-10-CM

## 2024-09-10 DIAGNOSIS — Z0000 Encounter for general adult medical examination without abnormal findings: Secondary | ICD-10-CM | POA: Diagnosis not present

## 2024-09-10 NOTE — Progress Notes (Signed)
 Subjective:    Patient ID: Benjamin Summers, male    DOB: 06/18/1976, 48 y.o.   MRN: [id number]  Patient here for  Chief Complaint  Patient presents with   Medical Management of Chronic Issues    3 MO F/U     HPI Here for a physical exam. Continues repatha . Intolerant to crestor , lipitor and pravastatin . Reports that two weeks ago, he had symptoms that appeared to be c/w previous kidney stones. Pain, emesis. Used a heating pad and started taking magnesium and B6. Also took ibuprofen. Pain resolved. No further pain. No hematuria. Never saw that he passed a kidney stone. Discussed further w/up and evaluation for possible kidney stoned. Declines at this time. Wants to monitor. He also reports intermittent increased anxiety. Is seeing a Publishing rights manager in [place], [place]). Anxiety increased with son at [facility] and traveling [place]. Started zoloft 50mg  one month ago. Also takes clonazepam prn - averages 2-3x/week. He is feeling better. Was seeing her q 2 weeks. Has extended out visits now. No chest pain or sob reported. No abdominal pain or bowel change reported.    Past Medical History:  Diagnosis Date   Depression    Elevated cholesterol    Hypertension    Vaginal infection    Past Surgical History:  Procedure Laterality Date   COLONOSCOPY WITH PROPOFOL  N/A 09/20/2021   Procedure: COLONOSCOPY WITH PROPOFOL ;  Surgeon: Therisa Bi, MD;  Location: [facility] ENDOSCOPY;  Service: Gastroenterology;  Laterality: N/A;   Family History  Problem Relation Age of Onset   Hypertension Mother    Diabetes Mother    Social History   Socioeconomic History   Marital status: Married    Spouse name: Not on file   Number of children: Not on file   Years of education: Not on file   Highest education level: Not on file  Occupational History   Not on file  Tobacco Use   Smoking status: Never   Smokeless tobacco: Never  Vaping Use   Vaping status: Never Used  Substance and Sexual Activity    Alcohol use: Yes    Alcohol/week: 7.0 standard drinks of alcohol    Types: 7 Standard drinks or equivalent per week   Drug use: No   Sexual activity: Yes    Birth control/protection: Surgical    Comment: Vasectomy 04/27/2020  Other Topics Concern   Not on file  Social History Narrative   Not on file   Social Drivers of Health   Financial Resource Strain: Patient Declined (06/04/2024)   Overall Financial Resource Strain (CARDIA)    Difficulty of Paying Living Expenses: Patient declined  Food Insecurity: Patient Declined (06/04/2024)   Hunger Vital Sign    Worried About Running Out of Food in the Last Year: Patient declined    Ran Out of Food in the Last Year: Patient declined  Transportation Needs: Patient Declined (06/04/2024)   PRAPARE - Administrator, Civil Service (Medical): Patient declined    Lack of Transportation (Non-Medical): Patient declined  Physical Activity: Unknown (06/04/2024)   Exercise Vital Sign    Days of Exercise per Week: Patient declined    Minutes of Exercise per Session: Not on file  Stress: Patient Declined (06/04/2024)   [employer] of Occupational Health - Occupational Stress Questionnaire    Feeling of Stress: Patient declined  Social Connections: Unknown (06/04/2024)   Social Connection and Isolation Panel    Frequency of Communication with Friends and Family: Patient declined  Frequency of Social Gatherings with Friends and Family: Patient declined    Attends Religious Services: Patient declined    Database administrator or Organizations: Patient declined    Attends Engineer, structural: Not on file    Marital Status: Patient declined     Review of Systems  Constitutional:  Negative for appetite change, fever and unexpected weight change.  HENT:  Negative for congestion and sinus pressure.   Respiratory:  Negative for cough, chest tightness and shortness of breath.   Cardiovascular:  Negative for chest pain,  palpitations and leg swelling.  Gastrointestinal:  Negative for abdominal pain, diarrhea, nausea and vomiting.  Genitourinary:  Negative for difficulty urinating and dysuria.  Musculoskeletal:  Negative for joint swelling and myalgias.  Skin:  Negative for color change and rash.  Neurological:  Negative for dizziness and headaches.  Psychiatric/Behavioral:  Negative for agitation and dysphoric mood.        Recent issues with increased anxiety as outlined. Doing better now.        Objective:     BP 118/64   Pulse 88   Temp 98 F (36.7 C) (Oral)   Ht 5' 10 (1.778 m)   Wt 273 lb (123.8 kg)   SpO2 96%   BMI 39.17 kg/m  Wt Readings from Last 3 Encounters:  09/10/24 273 lb (123.8 kg)  06/08/24 271 lb 9.6 oz (123.2 kg)  06/04/24 270 lb (122.5 kg)    Physical Exam Constitutional:      General: He is not in acute distress.    Appearance: Normal appearance. He is well-developed.  HENT:     Head: Normocephalic and atraumatic.     Right Ear: External ear normal.     Left Ear: External ear normal.     Mouth/Throat:     Pharynx: No oropharyngeal exudate or posterior oropharyngeal erythema.  Eyes:     General: No scleral icterus.       Right eye: No discharge.        Left eye: No discharge.  Cardiovascular:     Rate and Rhythm: Normal rate and regular rhythm.  Pulmonary:     Effort: Pulmonary effort is normal. No respiratory distress.     Breath sounds: Normal breath sounds.  Abdominal:     General: Bowel sounds are normal.     Palpations: Abdomen is soft.     Tenderness: There is no abdominal tenderness.  Musculoskeletal:        General: No swelling or tenderness.     Cervical back: Neck supple. No tenderness.  Lymphadenopathy:     Cervical: No cervical adenopathy.  Skin:    Findings: No erythema or rash.  Neurological:     Mental Status: He is alert.  Psychiatric:        Mood and Affect: Mood normal.        Behavior: Behavior normal.         Outpatient  Encounter Medications as of 09/10/2024  Medication Sig   amLODipine  (NORVASC ) 10 MG tablet Take 1 tablet (10 mg total) by mouth daily.   clonazePAM (KLONOPIN) 0.5 MG tablet Take 0.5 mg by mouth 2 (two) times daily as needed for anxiety.   Evolocumab  (REPATHA  SURECLICK) 140 MG/ML SOAJ INJECT 140 MG INTO THE SKIN EVERY 14 (FOURTEEN) DAYS.   hydrochlorothiazide  (HYDRODIURIL ) 25 MG tablet Take 1 tablet (25 mg total) by mouth daily.   losartan  (COZAAR ) 100 MG tablet Take 1 tablet (100 mg total) by mouth  daily.   sertraline (ZOLOFT) 50 MG tablet Take 50 mg by mouth daily.   [DISCONTINUED] Coenzyme Q10 (COQ10 PO) Take 1 capsule by mouth daily. (Patient not taking: Reported on 09/10/2024)   [DISCONTINUED] Omega-3 Fatty Acids (FISH OIL PO) Take 1 capsule by mouth daily. (Patient not taking: Reported on 09/10/2024)   No facility-administered encounter medications on file as of 09/10/2024.     Lab Results  Component Value Date   WBC 6.9 09/07/2024   HGB 14.4 09/07/2024   HCT 43.8 09/07/2024   PLT 279.0 09/07/2024   GLUCOSE 98 09/07/2024   CHOL 157 09/07/2024   TRIG 100.0 09/07/2024   HDL 49.40 09/07/2024   LDLDIRECT 110.0 10/29/2021   LDLCALC 88 09/07/2024   ALT 17 09/07/2024   AST 14 09/07/2024   NA 140 09/07/2024   K 3.4 (L) 09/07/2024   CL 102 09/07/2024   CREATININE 1.32 09/07/2024   BUN 16 09/07/2024   CO2 30 09/07/2024   TSH 0.74 03/19/2024   PSA 0.52 03/19/2024   HGBA1C 6.2 09/07/2024    CT CARDIAC SCORING (SELF PAY ONLY) Result Date: 03/22/2024 CLINICAL DATA:  Risk stratification EXAM: Coronary Calcium  Score TECHNIQUE: The patient was scanned on a Siemens Somatom scanner. Axial non-contrast 3 mm slices were carried out through the heart. The data set was analyzed on a dedicated work station and scored using the Agatson method. FINDINGS: Non-cardiac: See separate report from [facility]. Ascending Aorta: Normal size Pericardium: Normal Coronary arteries: Normal origin of left  and right coronary arteries. Distribution of arterial calcifications if present, as noted below; LM 0 LAD 0 LCx 0 RCA 0 Total 0 IMPRESSION AND RECOMMENDATION: 1. Coronary calcium  score of 0. 2. CAC 0, CAC-DRS A0. 3. Continue heart healthy lifestyle and risk factor modification. Electronically Signed   By: Redell Cave M.D.   On: 03/22/2024 13:16       Assessment & Plan:  Health care maintenance Assessment & Plan: Physical today 09/10/24. PSA - .52 (03/19/24). Colonoscopy 09/2021.    Hypokalemia Assessment & Plan: Recent potassium slightly decreased. Information on foods with increased potassium. Recheck potassium soon to confirm wnl.   Orders: -     Basic metabolic panel with GFR; Future  Statin myopathy Assessment & Plan: Intolerant to crestor , lipitor and pravastatin . Tolerating repatha .    Primary hypertension Assessment & Plan: Continue amlodipine , hydrochlorothiazide  and losartan . Follow pressures. Follow metabolic panel. No change in medication today.    Hyperlipidemia, unspecified hyperlipidemia type Assessment & Plan: Intolerant to crestor , lipitor and pravastatin . On repatha  now. Low cholesterol diet and exercise.discussed recent labs. LDL improved - 88 on recent check. No change in medication. Follow lipid panel.    Anxiety Assessment & Plan: Seeing a nurse practitioner as outlined. Continues on zoloft 50mg  q day. Has clonazepam to take prn. Averaging 2-3x/week. Feeing better. Continue f/u with psychiatry.       Allena Hamilton, MD

## 2024-09-10 NOTE — Assessment & Plan Note (Addendum)
 Physical today 09/10/24. PSA - .52 (03/19/24). Colonoscopy 09/2021.

## 2024-09-12 ENCOUNTER — Encounter: Payer: Self-pay | Admitting: Internal Medicine

## 2024-09-12 NOTE — Assessment & Plan Note (Signed)
 Recent potassium slightly decreased. Information on foods with increased potassium. Recheck potassium soon to confirm wnl.

## 2024-09-12 NOTE — Assessment & Plan Note (Signed)
 Continue amlodipine , hydrochlorothiazide  and losartan . Follow pressures. Follow metabolic panel. No change in medication today.

## 2024-09-12 NOTE — Assessment & Plan Note (Signed)
 Intolerant to crestor , lipitor and pravastatin . On repatha  now. Low cholesterol diet and exercise.discussed recent labs. LDL improved - 88 on recent check. No change in medication. Follow lipid panel.

## 2024-09-12 NOTE — Assessment & Plan Note (Signed)
 Seeing a Publishing rights manager as outlined. Continues on zoloft 50mg  q day. Has clonazepam to take prn. Averaging 2-3x/week. Feeing better. Continue f/u with psychiatry.

## 2024-09-12 NOTE — Assessment & Plan Note (Signed)
 Intolerant to crestor , lipitor and pravastatin . Tolerating repatha .

## 2024-10-01 ENCOUNTER — Other Ambulatory Visit (INDEPENDENT_AMBULATORY_CARE_PROVIDER_SITE_OTHER)

## 2024-10-01 DIAGNOSIS — E876 Hypokalemia: Secondary | ICD-10-CM

## 2024-10-01 LAB — BASIC METABOLIC PANEL WITH GFR
BUN: 15 mg/dL (ref 6–23)
CO2: 27 meq/L (ref 19–32)
Calcium: 8.5 mg/dL (ref 8.4–10.5)
Chloride: 101 meq/L (ref 96–112)
Creatinine, Ser: 1.15 mg/dL (ref 0.40–1.50)
GFR: 75.99 mL/min (ref >60.00)
Glucose, Bld: 93 mg/dL (ref 70–99)
Potassium: 3.5 meq/L (ref 3.5–5.1)
Sodium: 138 meq/L (ref 135–145)

## 2024-10-05 ENCOUNTER — Ambulatory Visit: Payer: Self-pay | Admitting: Internal Medicine

## 2024-12-10 ENCOUNTER — Other Ambulatory Visit: Payer: Self-pay | Admitting: Internal Medicine

## 2024-12-10 DIAGNOSIS — E785 Hyperlipidemia, unspecified: Secondary | ICD-10-CM

## 2024-12-10 DIAGNOSIS — T466X5A Adverse effect of antihyperlipidemic and antiarteriosclerotic drugs, initial encounter: Secondary | ICD-10-CM

## 2024-12-13 ENCOUNTER — Other Ambulatory Visit: Payer: Self-pay | Admitting: Internal Medicine

## 2024-12-13 DIAGNOSIS — I10 Essential (primary) hypertension: Secondary | ICD-10-CM

## 2025-01-11 ENCOUNTER — Other Ambulatory Visit: Payer: Self-pay

## 2025-01-11 DIAGNOSIS — R7303 Prediabetes: Secondary | ICD-10-CM

## 2025-01-11 DIAGNOSIS — E785 Hyperlipidemia, unspecified: Secondary | ICD-10-CM

## 2025-01-11 DIAGNOSIS — E876 Hypokalemia: Secondary | ICD-10-CM

## 2025-01-11 NOTE — Progress Notes (Signed)
 Order signed for future labs.

## 2025-01-12 ENCOUNTER — Other Ambulatory Visit

## 2025-01-12 DIAGNOSIS — E876 Hypokalemia: Secondary | ICD-10-CM

## 2025-01-12 DIAGNOSIS — E785 Hyperlipidemia, unspecified: Secondary | ICD-10-CM

## 2025-01-12 DIAGNOSIS — R7303 Prediabetes: Secondary | ICD-10-CM

## 2025-01-12 LAB — CBC WITH DIFFERENTIAL/PLATELET
Basophils Absolute: 0 10*3/uL (ref 0.0–0.1)
Basophils Relative: 0.5 % (ref 0.0–3.0)
Eosinophils Absolute: 0.2 10*3/uL (ref 0.0–0.7)
Eosinophils Relative: 2.9 % (ref 0.0–5.0)
HCT: 42.6 % (ref 39.0–52.0)
Hemoglobin: 14.3 g/dL (ref 13.0–17.0)
Lymphocytes Relative: 54.7 % — ABNORMAL HIGH (ref 12.0–46.0)
Lymphs Abs: 3.1 10*3/uL (ref 0.7–4.0)
MCHC: 33.5 g/dL (ref 30.0–36.0)
MCV: 83.8 fl (ref 78.0–100.0)
Monocytes Absolute: 0.3 10*3/uL (ref 0.1–1.0)
Monocytes Relative: 5.8 % (ref 3.0–12.0)
Neutro Abs: 2.1 10*3/uL (ref 1.4–7.7)
Neutrophils Relative %: 36.1 % — ABNORMAL LOW (ref 43.0–77.0)
Platelets: 230 10*3/uL (ref 150.0–400.0)
RBC: 5.08 Mil/uL (ref 4.22–5.81)
RDW: 15.3 % (ref 11.5–15.5)
WBC: 5.7 10*3/uL (ref 4.0–10.5)

## 2025-01-12 LAB — BASIC METABOLIC PANEL WITH GFR
BUN: 18 mg/dL (ref 6–23)
CO2: 27 meq/L (ref 19–32)
Calcium: 9 mg/dL (ref 8.4–10.5)
Chloride: 104 meq/L (ref 96–112)
Creatinine, Ser: 1.22 mg/dL (ref 0.40–1.50)
GFR: 70.65 mL/min
Glucose, Bld: 96 mg/dL (ref 70–99)
Potassium: 3.9 meq/L (ref 3.5–5.1)
Sodium: 139 meq/L (ref 135–145)

## 2025-01-12 LAB — HEPATIC FUNCTION PANEL
ALT: 19 U/L (ref 3–53)
AST: 15 U/L (ref 5–37)
Albumin: 4.4 g/dL (ref 3.5–5.2)
Alkaline Phosphatase: 36 U/L — ABNORMAL LOW (ref 39–117)
Bilirubin, Direct: 0.1 mg/dL (ref 0.1–0.3)
Total Bilirubin: 0.4 mg/dL (ref 0.2–1.2)
Total Protein: 6.8 g/dL (ref 6.0–8.3)

## 2025-01-12 LAB — HEMOGLOBIN A1C: Hgb A1c MFr Bld: 6.1 % (ref 4.6–6.5)

## 2025-01-12 LAB — LIPID PANEL
Cholesterol: 164 mg/dL (ref 28–200)
HDL: 45.3 mg/dL
LDL Cholesterol: 99 mg/dL (ref 10–99)
NonHDL: 118.52
Total CHOL/HDL Ratio: 4
Triglycerides: 98 mg/dL (ref 10.0–149.0)
VLDL: 19.6 mg/dL (ref 0.0–40.0)

## 2025-01-13 ENCOUNTER — Ambulatory Visit: Payer: Self-pay | Admitting: Internal Medicine

## 2025-01-14 ENCOUNTER — Ambulatory Visit: Admitting: Internal Medicine

## 2025-01-19 ENCOUNTER — Ambulatory Visit: Admitting: Internal Medicine
